# Patient Record
Sex: Female | Born: 1950 | Race: White | Hispanic: No | Marital: Married | Smoking: Never smoker
Health system: Southern US, Community
[De-identification: ages and names within clinical notes are randomized; demographics above are authoritative.]

## PROBLEM LIST (undated history)

## (undated) DIAGNOSIS — G4730 Sleep apnea, unspecified: Secondary | ICD-10-CM

## (undated) DIAGNOSIS — M810 Age-related osteoporosis without current pathological fracture: Secondary | ICD-10-CM

## (undated) DIAGNOSIS — E871 Hypo-osmolality and hyponatremia: Secondary | ICD-10-CM

## (undated) DIAGNOSIS — Z8781 Personal history of (healed) traumatic fracture: Secondary | ICD-10-CM

## (undated) DIAGNOSIS — I10 Essential (primary) hypertension: Secondary | ICD-10-CM

## (undated) DIAGNOSIS — T7840XA Allergy, unspecified, initial encounter: Secondary | ICD-10-CM

## (undated) DIAGNOSIS — K219 Gastro-esophageal reflux disease without esophagitis: Secondary | ICD-10-CM

## (undated) DIAGNOSIS — J45909 Unspecified asthma, uncomplicated: Secondary | ICD-10-CM

## (undated) DIAGNOSIS — M1990 Unspecified osteoarthritis, unspecified site: Secondary | ICD-10-CM

## (undated) DIAGNOSIS — R011 Cardiac murmur, unspecified: Secondary | ICD-10-CM

## (undated) DIAGNOSIS — D649 Anemia, unspecified: Secondary | ICD-10-CM

## (undated) HISTORY — DX: Cardiac murmur, unspecified: R01.1

## (undated) HISTORY — PX: OTHER SURGICAL HISTORY: SHX169

## (undated) HISTORY — DX: Anemia, unspecified: D64.9

## (undated) HISTORY — DX: Age-related osteoporosis without current pathological fracture: M81.0

## (undated) HISTORY — DX: Gastro-esophageal reflux disease without esophagitis: K21.9

## (undated) HISTORY — DX: Unspecified asthma, uncomplicated: J45.909

## (undated) HISTORY — DX: Essential (primary) hypertension: I10

## (undated) HISTORY — DX: Allergy, unspecified, initial encounter: T78.40XA

## (undated) HISTORY — DX: Unspecified osteoarthritis, unspecified site: M19.90

---

## 1998-07-11 ENCOUNTER — Other Ambulatory Visit: Admission: RE | Admit: 1998-07-11 | Discharge: 1998-07-11 | Payer: Self-pay | Admitting: *Deleted

## 1998-11-17 ENCOUNTER — Ambulatory Visit (HOSPITAL_BASED_OUTPATIENT_CLINIC_OR_DEPARTMENT_OTHER): Admission: RE | Admit: 1998-11-17 | Discharge: 1998-11-17 | Payer: Self-pay | Admitting: *Deleted

## 1999-08-02 ENCOUNTER — Other Ambulatory Visit: Admission: RE | Admit: 1999-08-02 | Discharge: 1999-08-02 | Payer: Self-pay | Admitting: *Deleted

## 1999-09-25 ENCOUNTER — Ambulatory Visit (HOSPITAL_COMMUNITY): Admission: RE | Admit: 1999-09-25 | Discharge: 1999-09-25 | Payer: Self-pay | Admitting: Obstetrics and Gynecology

## 1999-09-25 ENCOUNTER — Encounter: Payer: Self-pay | Admitting: Obstetrics and Gynecology

## 1999-09-27 ENCOUNTER — Encounter: Admission: RE | Admit: 1999-09-27 | Discharge: 1999-09-27 | Payer: Self-pay | Admitting: Obstetrics and Gynecology

## 1999-09-27 ENCOUNTER — Encounter: Payer: Self-pay | Admitting: Obstetrics and Gynecology

## 2000-02-26 HISTORY — PX: BREAST BIOPSY: SHX20

## 2000-03-31 ENCOUNTER — Encounter: Admission: RE | Admit: 2000-03-31 | Discharge: 2000-03-31 | Payer: Self-pay | Admitting: Obstetrics and Gynecology

## 2000-03-31 ENCOUNTER — Encounter: Payer: Self-pay | Admitting: Obstetrics and Gynecology

## 2000-08-13 ENCOUNTER — Other Ambulatory Visit: Admission: RE | Admit: 2000-08-13 | Discharge: 2000-08-13 | Payer: Self-pay | Admitting: *Deleted

## 2000-09-01 ENCOUNTER — Encounter: Payer: Self-pay | Admitting: Obstetrics and Gynecology

## 2000-09-01 ENCOUNTER — Encounter: Admission: RE | Admit: 2000-09-01 | Discharge: 2000-09-01 | Payer: Self-pay | Admitting: Obstetrics and Gynecology

## 2001-07-31 ENCOUNTER — Other Ambulatory Visit: Admission: RE | Admit: 2001-07-31 | Discharge: 2001-07-31 | Payer: Self-pay | Admitting: Obstetrics and Gynecology

## 2001-08-13 ENCOUNTER — Ambulatory Visit (HOSPITAL_COMMUNITY): Admission: RE | Admit: 2001-08-13 | Discharge: 2001-08-13 | Payer: Self-pay | Admitting: Obstetrics and Gynecology

## 2001-08-13 ENCOUNTER — Encounter: Payer: Self-pay | Admitting: Obstetrics and Gynecology

## 2002-08-06 ENCOUNTER — Other Ambulatory Visit: Admission: RE | Admit: 2002-08-06 | Discharge: 2002-08-06 | Payer: Self-pay | Admitting: Obstetrics and Gynecology

## 2002-08-16 ENCOUNTER — Ambulatory Visit (HOSPITAL_COMMUNITY): Admission: RE | Admit: 2002-08-16 | Discharge: 2002-08-16 | Payer: Self-pay | Admitting: Obstetrics and Gynecology

## 2002-08-16 ENCOUNTER — Encounter: Payer: Self-pay | Admitting: Obstetrics and Gynecology

## 2003-09-16 ENCOUNTER — Encounter: Admission: RE | Admit: 2003-09-16 | Discharge: 2003-09-16 | Payer: Self-pay | Admitting: Obstetrics and Gynecology

## 2003-10-05 ENCOUNTER — Encounter: Admission: RE | Admit: 2003-10-05 | Discharge: 2003-10-05 | Payer: Self-pay | Admitting: Obstetrics and Gynecology

## 2004-11-28 ENCOUNTER — Encounter: Admission: RE | Admit: 2004-11-28 | Discharge: 2004-11-28 | Payer: Self-pay | Admitting: *Deleted

## 2005-01-23 ENCOUNTER — Ambulatory Visit: Payer: Self-pay | Admitting: Gastroenterology

## 2005-02-27 ENCOUNTER — Ambulatory Visit: Payer: Self-pay | Admitting: Gastroenterology

## 2005-03-07 ENCOUNTER — Ambulatory Visit: Payer: Self-pay | Admitting: Gastroenterology

## 2005-12-11 ENCOUNTER — Encounter: Admission: RE | Admit: 2005-12-11 | Discharge: 2005-12-11 | Payer: Self-pay | Admitting: *Deleted

## 2005-12-25 ENCOUNTER — Encounter: Admission: RE | Admit: 2005-12-25 | Discharge: 2005-12-25 | Payer: Self-pay | Admitting: *Deleted

## 2007-01-13 ENCOUNTER — Encounter: Admission: RE | Admit: 2007-01-13 | Discharge: 2007-01-13 | Payer: Self-pay | Admitting: Obstetrics

## 2007-12-01 ENCOUNTER — Ambulatory Visit: Payer: Self-pay | Admitting: Gastroenterology

## 2007-12-01 DIAGNOSIS — R1032 Left lower quadrant pain: Secondary | ICD-10-CM

## 2007-12-01 LAB — CONVERTED CEMR LAB: CEA: 0.5 ng/mL (ref 0.0–5.0)

## 2007-12-03 LAB — CONVERTED CEMR LAB
AST: 17 units/L (ref 0–37)
Alkaline Phosphatase: 53 units/L (ref 39–117)
BUN: 8 mg/dL (ref 6–23)
Eosinophils Absolute: 0.2 10*3/uL (ref 0.0–0.7)
GFR calc non Af Amer: 110 mL/min
Glucose, Bld: 101 mg/dL — ABNORMAL HIGH (ref 70–99)
HCT: 38.9 % (ref 36.0–46.0)
Hemoglobin: 13 g/dL (ref 12.0–15.0)
MCV: 85.2 fL (ref 78.0–100.0)
Monocytes Absolute: 0.6 10*3/uL (ref 0.1–1.0)
Monocytes Relative: 7.2 % (ref 3.0–12.0)
Neutro Abs: 4.2 10*3/uL (ref 1.4–7.7)
RDW: 13.7 % (ref 11.5–14.6)
Sodium: 137 meq/L (ref 135–145)
Total Bilirubin: 0.8 mg/dL (ref 0.3–1.2)

## 2007-12-04 ENCOUNTER — Ambulatory Visit: Payer: Self-pay | Admitting: Cardiology

## 2008-01-01 ENCOUNTER — Ambulatory Visit: Payer: Self-pay | Admitting: Gastroenterology

## 2008-01-01 LAB — CONVERTED CEMR LAB
OCCULT 2: NEGATIVE
OCCULT 3: NEGATIVE

## 2008-01-06 LAB — CONVERTED CEMR LAB
Fecal Occult Blood: NEGATIVE
OCCULT 4: NEGATIVE
OCCULT 5: NEGATIVE

## 2008-01-25 ENCOUNTER — Encounter: Admission: RE | Admit: 2008-01-25 | Discharge: 2008-01-25 | Payer: Self-pay | Admitting: Obstetrics

## 2008-02-05 ENCOUNTER — Ambulatory Visit: Payer: Self-pay | Admitting: Gastroenterology

## 2009-03-23 ENCOUNTER — Encounter: Admission: RE | Admit: 2009-03-23 | Discharge: 2009-03-23 | Payer: Self-pay | Admitting: Obstetrics

## 2009-09-19 ENCOUNTER — Encounter: Admission: RE | Admit: 2009-09-19 | Discharge: 2009-09-19 | Payer: Self-pay | Admitting: Obstetrics

## 2010-03-16 ENCOUNTER — Encounter: Payer: Self-pay | Admitting: Gastroenterology

## 2010-03-29 NOTE — Letter (Signed)
Summary: Colonoscopy Date Change Letter  [facility]  [address] [place], [place] [address]   Phone: [phone]  Fax: [phone]      March 16, 2010 MRN: [id number]   [facility] [address] [place], [place]  [address]   Dear Ms. Abdella,   Previously you were recommended to have a repeat colonoscopy around this time. Your chart was recently reviewed by DR. Shihab States of [facility]. Follow up colonoscopy is now recommended in JAN 2017. This revised recommendation is based on current, nationally recognized guidelines for colorectal cancer screening and polyp surveillance. These guidelines are endorsed by the American Cancer Society, [employer] on Colorectal Cancer as well as numerous other major medical organizations.  Please understand that our recommendation assumes that you do not have any new symptoms such as bleeding, a change in bowel habits, anemia, or significant abdominal discomfort. If you do have any concerning GI symptoms or want to discuss the guideline recommendations, please call to arrange an office visit at your earliest convenience. Otherwise we will keep you in our reminder system and contact you 1-2 months prior to the date listed above to schedule your next colonoscopy.  Thank you,  Rachael Fee, M.D.  [facility] [phone]

## 2010-04-25 ENCOUNTER — Other Ambulatory Visit: Payer: Self-pay | Admitting: Obstetrics

## 2010-04-25 DIAGNOSIS — Z1231 Encounter for screening mammogram for malignant neoplasm of breast: Secondary | ICD-10-CM

## 2010-04-30 ENCOUNTER — Ambulatory Visit
Admission: RE | Admit: 2010-04-30 | Discharge: 2010-04-30 | Disposition: A | Discharge status: Home / Self Care | Payer: BC Managed Care – PPO | Source: Ambulatory Visit | Attending: Obstetrics | Admitting: Obstetrics

## 2010-04-30 DIAGNOSIS — Z1231 Encounter for screening mammogram for malignant neoplasm of breast: Secondary | ICD-10-CM

## 2011-06-10 ENCOUNTER — Other Ambulatory Visit: Payer: Self-pay | Admitting: Obstetrics

## 2011-06-10 DIAGNOSIS — Z1231 Encounter for screening mammogram for malignant neoplasm of breast: Secondary | ICD-10-CM

## 2011-06-27 ENCOUNTER — Ambulatory Visit
Admission: RE | Admit: 2011-06-27 | Discharge: 2011-06-27 | Disposition: A | Discharge status: Home / Self Care | Payer: BC Managed Care – PPO | Source: Ambulatory Visit | Attending: Obstetrics | Admitting: Obstetrics

## 2011-06-27 DIAGNOSIS — Z1231 Encounter for screening mammogram for malignant neoplasm of breast: Secondary | ICD-10-CM

## 2011-12-02 ENCOUNTER — Other Ambulatory Visit: Payer: Self-pay | Admitting: Obstetrics

## 2011-12-02 DIAGNOSIS — M810 Age-related osteoporosis without current pathological fracture: Secondary | ICD-10-CM

## 2011-12-12 ENCOUNTER — Ambulatory Visit
Admission: RE | Admit: 2011-12-12 | Discharge: 2011-12-12 | Disposition: A | Discharge status: Home / Self Care | Payer: BC Managed Care – PPO | Source: Ambulatory Visit | Attending: Obstetrics | Admitting: Obstetrics

## 2011-12-12 DIAGNOSIS — M810 Age-related osteoporosis without current pathological fracture: Secondary | ICD-10-CM

## 2012-02-26 HISTORY — PX: OTHER SURGICAL HISTORY: SHX169

## 2012-08-20 ENCOUNTER — Other Ambulatory Visit: Payer: Self-pay

## 2012-08-20 DIAGNOSIS — Z1231 Encounter for screening mammogram for malignant neoplasm of breast: Secondary | ICD-10-CM

## 2012-09-17 ENCOUNTER — Ambulatory Visit: Payer: BC Managed Care – PPO

## 2012-12-31 ENCOUNTER — Other Ambulatory Visit: Payer: Self-pay | Admitting: Dermatology

## 2013-01-11 ENCOUNTER — Ambulatory Visit
Admission: RE | Admit: 2013-01-11 | Discharge: 2013-01-11 | Disposition: A | Discharge status: Home / Self Care | Payer: BC Managed Care – PPO | Source: Ambulatory Visit

## 2013-01-11 DIAGNOSIS — Z1231 Encounter for screening mammogram for malignant neoplasm of breast: Secondary | ICD-10-CM

## 2013-02-08 ENCOUNTER — Other Ambulatory Visit: Payer: Self-pay | Admitting: Obstetrics

## 2013-02-08 DIAGNOSIS — M8580 Other specified disorders of bone density and structure, unspecified site: Secondary | ICD-10-CM

## 2013-09-15 ENCOUNTER — Ambulatory Visit
Admission: RE | Admit: 2013-09-15 | Discharge: 2013-09-15 | Disposition: A | Discharge status: Home / Self Care | Payer: BC Managed Care – PPO | Source: Ambulatory Visit | Attending: Obstetrics | Admitting: Obstetrics

## 2013-09-15 ENCOUNTER — Encounter (INDEPENDENT_AMBULATORY_CARE_PROVIDER_SITE_OTHER): Payer: Self-pay

## 2013-09-15 DIAGNOSIS — M8580 Other specified disorders of bone density and structure, unspecified site: Secondary | ICD-10-CM

## 2014-01-31 ENCOUNTER — Other Ambulatory Visit: Payer: Self-pay

## 2014-01-31 DIAGNOSIS — Z1231 Encounter for screening mammogram for malignant neoplasm of breast: Secondary | ICD-10-CM

## 2014-02-11 ENCOUNTER — Ambulatory Visit: Payer: BC Managed Care – PPO

## 2014-02-28 ENCOUNTER — Ambulatory Visit
Admission: RE | Admit: 2014-02-28 | Discharge: 2014-02-28 | Disposition: A | Discharge status: Home / Self Care | Payer: BC Managed Care – PPO | Source: Ambulatory Visit

## 2014-02-28 DIAGNOSIS — Z1231 Encounter for screening mammogram for malignant neoplasm of breast: Secondary | ICD-10-CM

## 2015-03-29 ENCOUNTER — Other Ambulatory Visit: Payer: Self-pay

## 2015-03-29 DIAGNOSIS — Z1231 Encounter for screening mammogram for malignant neoplasm of breast: Secondary | ICD-10-CM

## 2015-04-14 ENCOUNTER — Ambulatory Visit
Admission: RE | Admit: 2015-04-14 | Discharge: 2015-04-14 | Disposition: A | Discharge status: Home / Self Care | Payer: BC Managed Care – PPO | Source: Ambulatory Visit

## 2015-04-14 DIAGNOSIS — Z1231 Encounter for screening mammogram for malignant neoplasm of breast: Secondary | ICD-10-CM

## 2015-05-18 ENCOUNTER — Encounter: Payer: Self-pay | Admitting: Internal Medicine

## 2016-02-26 HISTORY — PX: COLONOSCOPY W/ POLYPECTOMY: SHX1380

## 2016-03-28 ENCOUNTER — Other Ambulatory Visit: Payer: Self-pay | Admitting: Obstetrics

## 2016-03-28 DIAGNOSIS — M8580 Other specified disorders of bone density and structure, unspecified site: Secondary | ICD-10-CM

## 2016-03-28 DIAGNOSIS — Z1231 Encounter for screening mammogram for malignant neoplasm of breast: Secondary | ICD-10-CM

## 2016-04-15 ENCOUNTER — Ambulatory Visit
Admission: RE | Admit: 2016-04-15 | Discharge: 2016-04-15 | Disposition: A | Discharge status: Home / Self Care | Payer: BC Managed Care – PPO | Source: Ambulatory Visit | Attending: Obstetrics | Admitting: Obstetrics

## 2016-04-15 DIAGNOSIS — M8580 Other specified disorders of bone density and structure, unspecified site: Secondary | ICD-10-CM

## 2016-04-15 DIAGNOSIS — Z1231 Encounter for screening mammogram for malignant neoplasm of breast: Secondary | ICD-10-CM

## 2016-07-01 ENCOUNTER — Encounter: Payer: Self-pay | Admitting: Gastroenterology

## 2016-09-18 ENCOUNTER — Encounter: Payer: Self-pay | Admitting: Gastroenterology

## 2016-09-18 ENCOUNTER — Ambulatory Visit (AMBULATORY_SURGERY_CENTER): Payer: Self-pay

## 2016-09-18 VITALS — Ht 63.0 in | Wt 223.4 lb

## 2016-09-18 DIAGNOSIS — Z1211 Encounter for screening for malignant neoplasm of colon: Secondary | ICD-10-CM

## 2016-09-18 MED ORDER — NA SULFATE-K SULFATE-MG SULF 17.5-3.13-1.6 GM/177ML PO SOLN: 1.0000 | Freq: Once | ORAL | 0 refills | Status: AC

## 2016-09-18 NOTE — Progress Notes (Signed)
Denies allergies to eggs or soy products. Denies complication of anesthesia or sedation. Denies use of weight loss medication. Denies use of O2.   Emmi instructions declined.  

## 2016-09-24 ENCOUNTER — Ambulatory Visit (AMBULATORY_SURGERY_CENTER): Payer: BC Managed Care – PPO | Admitting: Gastroenterology

## 2016-09-24 ENCOUNTER — Encounter: Payer: Self-pay | Admitting: Gastroenterology

## 2016-09-24 VITALS — BP 145/82 | HR 69 | Temp 98.0°F | Resp 17 | Ht 63.0 in | Wt 223.0 lb

## 2016-09-24 DIAGNOSIS — K5730 Diverticulosis of large intestine without perforation or abscess without bleeding: Secondary | ICD-10-CM

## 2016-09-24 DIAGNOSIS — D123 Benign neoplasm of transverse colon: Secondary | ICD-10-CM | POA: Diagnosis not present

## 2016-09-24 DIAGNOSIS — Z1211 Encounter for screening for malignant neoplasm of colon: Secondary | ICD-10-CM | POA: Diagnosis present

## 2016-09-24 DIAGNOSIS — Z1212 Encounter for screening for malignant neoplasm of rectum: Secondary | ICD-10-CM

## 2016-09-24 MED ORDER — SODIUM CHLORIDE 0.9 % IV SOLN: 500.0000 mL | INTRAVENOUS | Status: AC

## 2016-09-24 NOTE — Patient Instructions (Signed)
YOU HAD AN ENDOSCOPIC PROCEDURE TODAY AT THE [facility]:   Refer to the procedure report that was given to you for any specific questions about what was found during the examination.  If the procedure report does not answer your questions, please call your gastroenterologist to clarify.  If you requested that your care partner not be given the details of your procedure findings, then the procedure report has been included in a sealed envelope for you to review at your convenience later.  YOU SHOULD EXPECT: Some feelings of bloating in the abdomen. Passage of more gas than usual.  Walking can help get rid of the air that was put into your GI tract during the procedure and reduce the bloating. If you had a lower endoscopy (such as a colonoscopy or flexible sigmoidoscopy) you may notice spotting of blood in your stool or on the toilet paper. If you underwent a bowel prep for your procedure, you may not have a normal bowel movement for a few days.  Please Note:  You might notice some irritation and congestion in your nose or some drainage.  This is from the oxygen used during your procedure.  There is no need for concern and it should clear up in a day or so.  SYMPTOMS TO REPORT IMMEDIATELY:   Following lower endoscopy (colonoscopy or flexible sigmoidoscopy):  Excessive amounts of blood in the stool  Significant tenderness or worsening of abdominal pains  Swelling of the abdomen that is new, acute  Fever of 100F or higher   For urgent or emergent issues, a gastroenterologist can be reached at any hour by calling [phone]. Please read all handouts given to you by your recovery nurse today.  DIET:  We do recommend a small meal at first, but then you may proceed to your regular diet.  Drink plenty of fluids but you should avoid alcoholic beverages for 24 hours.  ACTIVITY:  You should plan to take it easy for the rest of today and you should NOT DRIVE or use heavy machinery until  tomorrow (because of the sedation medicines used during the test).    FOLLOW UP: Our staff will call the number listed on your records the next business day following your procedure to check on you and address any questions or concerns that you may have regarding the information given to you following your procedure. If we do not reach you, we will leave a message.  However, if you are feeling well and you are not experiencing any problems, there is no need to return our call.  We will assume that you have returned to your regular daily activities without incident.  If any biopsies were taken you will be contacted by phone or by letter within the next 1-3 weeks.  Please call us at [phone] if you have not heard about the biopsies in 3 weeks.    SIGNATURES/CONFIDENTIALITY: You and/or your care partner have signed paperwork which will be entered into your electronic medical record.  These signatures attest to the fact that that the information above on your After Visit Summary has been reviewed and is understood.  Full responsibility of the confidentiality of this discharge information lies with you and/or your care-partner.  Thank you for letting us take care of your healthcare needs today. 

## 2016-09-24 NOTE — Op Note (Signed)
[name] Procedure Date: 09/24/2016 11:03 AM MRN: [id number] Endoscopist: Milus Banister , MD Age: 66 Referring MD:  Date of Birth: [birth year] Gender: Female Account #: [id number] Procedure:                Colonoscopy Indications:              Screening for colorectal malignant neoplasm Medicines:                Monitored Anesthesia Care Procedure:                Pre-Anesthesia Assessment:                           - Prior to the procedure, a History and Physical                            was performed, and patient medications and                            allergies were reviewed. The patient's tolerance of                            previous anesthesia was also reviewed. The risks                            and benefits of the procedure and the sedation                            options and risks were discussed with the patient.                            All questions were answered, and informed consent                            was obtained. Prior Anticoagulants: The patient has                            taken no previous anticoagulant or antiplatelet                            agents. ASA Grade Assessment: II - A patient with                            mild systemic disease. After reviewing the risks                            and benefits, the patient was deemed in                            satisfactory condition to undergo the procedure.                           After obtaining informed consent, the colonoscope  was passed under direct vision. Throughout the                            procedure, the patient's blood pressure, pulse, and                            oxygen saturations were monitored continuously. The                            Colonoscope was introduced through the anus and                            advanced to the the cecum, identified by                            appendiceal orifice and  ileocecal valve. The                            colonoscopy was performed without difficulty. The                            patient tolerated the procedure well. The quality                            of the bowel preparation was good. The ileocecal                            valve, appendiceal orifice, and rectum were                            photographed. Scope In: 11:11:29 AM Scope Out: 11:21:43 AM Scope Withdrawal Time: 0 hours 6 minutes 2 seconds  Total Procedure Duration: 0 hours 10 minutes 14 seconds  Findings:                 A 2 mm polyp was found in the transverse colon. The                            polyp was sessile. The polyp was removed with a                            cold biopsy forceps. Resection and retrieval were                            complete.                           Multiple small and large-mouthed diverticula were                            found in the left colon with associated mucosal                            edema, narrowing, erythema.  The exam was otherwise without abnormality on                            direct and retroflexion views. Complications:            No immediate complications. Estimated blood loss:                            None. Estimated Blood Loss:     Estimated blood loss: none. Impression:               - One 2 mm polyp in the transverse colon, removed                            with a cold biopsy forceps. Resected and retrieved.                           - Diverticulosis in the left colon with associated                            mucosal edema, narrowing, erythema.                           - The examination was otherwise normal on direct                            and retroflexion views. Recommendation:           - Patient has a contact number available for                            emergencies. The signs and symptoms of potential                            delayed complications were discussed with  the                            patient. Return to normal activities tomorrow.                            Written discharge instructions were provided to the                            patient.                           - Resume previous diet.                           - Continue present medications.                           You will receive a letter within 2-3 weeks with the                            pathology results and my final recommendations.  If the polyp(s) is proven to be 'pre-cancerous' on                            pathology, you will need repeat colonoscopy in 5                            years. If the polyp(s) is NOT 'precancerous' on                            pathology then you should repeat colon cancer                            screening in 10 years with colonoscopy without need                            for colon cancer screening by any method prior to                            then (including stool testing). Milus Banister, MD 09/24/2016 11:27:09 AM This report has been signed electronically.

## 2016-09-24 NOTE — Progress Notes (Signed)
To PACU VSS. Report to RN.tb 

## 2016-09-24 NOTE — Progress Notes (Signed)
Called to room to assist during endoscopic procedure.  Patient ID and intended procedure confirmed with present staff. Received instructions for my participation in the procedure from the performing physician.  

## 2016-09-24 NOTE — Progress Notes (Signed)
Pt. Reports no change in her surgical or medical history since pre-visit 09/18/2016.

## 2016-09-25 ENCOUNTER — Telehealth: Payer: Self-pay

## 2016-09-25 NOTE — Telephone Encounter (Signed)
  Follow up Call-  Call back number 09/24/2016  Post procedure Call Back phone  # [phone]  Permission to leave phone message Yes  Some recent data might be hidden     Patient questions:  Do you have a fever, pain , or abdominal swelling? No. Pain Score  0 *  Have you tolerated food without any problems? Yes.    Have you been able to return to your normal activities? Yes.    Do you have any questions about your discharge instructions: Diet   No. Medications  No. Follow up visit  No.  Do you have questions or concerns about your Care? No.  Actions: * If pain score is 4 or above: No action needed, pain <4.  No problems noted per pt. maw

## 2016-09-27 ENCOUNTER — Encounter: Payer: Self-pay | Admitting: Gastroenterology

## 2017-07-22 ENCOUNTER — Other Ambulatory Visit: Payer: Self-pay | Admitting: Obstetrics

## 2017-07-22 DIAGNOSIS — Z1231 Encounter for screening mammogram for malignant neoplasm of breast: Secondary | ICD-10-CM

## 2017-08-13 ENCOUNTER — Ambulatory Visit
Admission: RE | Admit: 2017-08-13 | Discharge: 2017-08-13 | Disposition: A | Discharge status: Home / Self Care | Payer: BC Managed Care – PPO | Source: Ambulatory Visit | Attending: Obstetrics | Admitting: Obstetrics

## 2017-08-13 DIAGNOSIS — Z1231 Encounter for screening mammogram for malignant neoplasm of breast: Secondary | ICD-10-CM

## 2017-10-22 ENCOUNTER — Other Ambulatory Visit: Payer: Self-pay | Admitting: Obstetrics

## 2017-10-22 DIAGNOSIS — M810 Age-related osteoporosis without current pathological fracture: Secondary | ICD-10-CM

## 2018-05-22 ENCOUNTER — Other Ambulatory Visit: Payer: BC Managed Care – PPO

## 2018-07-15 ENCOUNTER — Other Ambulatory Visit: Payer: BC Managed Care – PPO

## 2018-09-07 ENCOUNTER — Other Ambulatory Visit: Payer: Self-pay | Admitting: Obstetrics

## 2018-09-07 DIAGNOSIS — Z1231 Encounter for screening mammogram for malignant neoplasm of breast: Secondary | ICD-10-CM

## 2018-09-14 ENCOUNTER — Other Ambulatory Visit: Payer: BC Managed Care – PPO

## 2018-09-23 ENCOUNTER — Other Ambulatory Visit: Payer: Self-pay

## 2018-09-23 ENCOUNTER — Ambulatory Visit
Admission: RE | Admit: 2018-09-23 | Discharge: 2018-09-23 | Disposition: A | Discharge status: Home / Self Care | Payer: Medicare Other | Source: Ambulatory Visit | Attending: Obstetrics | Admitting: Obstetrics

## 2018-09-23 DIAGNOSIS — M810 Age-related osteoporosis without current pathological fracture: Secondary | ICD-10-CM

## 2018-10-23 ENCOUNTER — Ambulatory Visit
Admission: RE | Admit: 2018-10-23 | Discharge: 2018-10-23 | Disposition: A | Discharge status: Home / Self Care | Payer: BC Managed Care – PPO | Source: Ambulatory Visit | Attending: Obstetrics | Admitting: Obstetrics

## 2018-10-23 ENCOUNTER — Other Ambulatory Visit: Payer: Self-pay

## 2018-10-23 DIAGNOSIS — Z1231 Encounter for screening mammogram for malignant neoplasm of breast: Secondary | ICD-10-CM

## 2019-06-10 DIAGNOSIS — L821 Other seborrheic keratosis: Secondary | ICD-10-CM | POA: Diagnosis not present

## 2019-06-21 DIAGNOSIS — H1045 Other chronic allergic conjunctivitis: Secondary | ICD-10-CM | POA: Diagnosis not present

## 2019-06-21 DIAGNOSIS — J3081 Allergic rhinitis due to animal (cat) (dog) hair and dander: Secondary | ICD-10-CM | POA: Diagnosis not present

## 2019-06-21 DIAGNOSIS — J4540 Moderate persistent asthma, uncomplicated: Secondary | ICD-10-CM | POA: Diagnosis not present

## 2019-06-21 DIAGNOSIS — J3089 Other allergic rhinitis: Secondary | ICD-10-CM | POA: Diagnosis not present

## 2019-07-27 DIAGNOSIS — E559 Vitamin D deficiency, unspecified: Secondary | ICD-10-CM | POA: Diagnosis not present

## 2019-07-27 DIAGNOSIS — Z79899 Other long term (current) drug therapy: Secondary | ICD-10-CM | POA: Diagnosis not present

## 2019-07-27 DIAGNOSIS — I10 Essential (primary) hypertension: Secondary | ICD-10-CM | POA: Diagnosis not present

## 2019-07-27 DIAGNOSIS — E7800 Pure hypercholesterolemia, unspecified: Secondary | ICD-10-CM | POA: Diagnosis not present

## 2019-07-27 DIAGNOSIS — Z1331 Encounter for screening for depression: Secondary | ICD-10-CM | POA: Diagnosis not present

## 2019-07-27 DIAGNOSIS — Z6839 Body mass index (BMI) 39.0-39.9, adult: Secondary | ICD-10-CM | POA: Diagnosis not present

## 2019-07-27 DIAGNOSIS — J45909 Unspecified asthma, uncomplicated: Secondary | ICD-10-CM | POA: Diagnosis not present

## 2019-07-27 DIAGNOSIS — M810 Age-related osteoporosis without current pathological fracture: Secondary | ICD-10-CM | POA: Diagnosis not present

## 2019-07-27 DIAGNOSIS — D692 Other nonthrombocytopenic purpura: Secondary | ICD-10-CM | POA: Diagnosis not present

## 2019-08-18 DIAGNOSIS — E871 Hypo-osmolality and hyponatremia: Secondary | ICD-10-CM | POA: Diagnosis not present

## 2019-09-17 ENCOUNTER — Other Ambulatory Visit: Payer: Self-pay | Admitting: Obstetrics

## 2019-09-17 DIAGNOSIS — Z1231 Encounter for screening mammogram for malignant neoplasm of breast: Secondary | ICD-10-CM

## 2019-11-03 ENCOUNTER — Other Ambulatory Visit: Payer: Self-pay

## 2019-11-03 ENCOUNTER — Ambulatory Visit
Admission: RE | Admit: 2019-11-03 | Discharge: 2019-11-03 | Disposition: A | Discharge status: Home / Self Care | Payer: Medicare Other | Source: Ambulatory Visit | Attending: Obstetrics | Admitting: Obstetrics

## 2019-11-03 DIAGNOSIS — Z1231 Encounter for screening mammogram for malignant neoplasm of breast: Secondary | ICD-10-CM

## 2019-11-15 DIAGNOSIS — E785 Hyperlipidemia, unspecified: Secondary | ICD-10-CM | POA: Diagnosis not present

## 2019-11-15 DIAGNOSIS — Z9181 History of falling: Secondary | ICD-10-CM | POA: Diagnosis not present

## 2019-11-15 DIAGNOSIS — Z6839 Body mass index (BMI) 39.0-39.9, adult: Secondary | ICD-10-CM | POA: Diagnosis not present

## 2019-11-15 DIAGNOSIS — Z1331 Encounter for screening for depression: Secondary | ICD-10-CM | POA: Diagnosis not present

## 2019-11-15 DIAGNOSIS — Z0000 Encounter for general adult medical examination without abnormal findings: Secondary | ICD-10-CM | POA: Diagnosis not present

## 2019-12-07 DIAGNOSIS — L820 Inflamed seborrheic keratosis: Secondary | ICD-10-CM | POA: Diagnosis not present

## 2019-12-07 DIAGNOSIS — L814 Other melanin hyperpigmentation: Secondary | ICD-10-CM | POA: Diagnosis not present

## 2019-12-07 DIAGNOSIS — D2371 Other benign neoplasm of skin of right lower limb, including hip: Secondary | ICD-10-CM | POA: Diagnosis not present

## 2019-12-07 DIAGNOSIS — D692 Other nonthrombocytopenic purpura: Secondary | ICD-10-CM | POA: Diagnosis not present

## 2019-12-07 DIAGNOSIS — L821 Other seborrheic keratosis: Secondary | ICD-10-CM | POA: Diagnosis not present

## 2019-12-07 DIAGNOSIS — L718 Other rosacea: Secondary | ICD-10-CM | POA: Diagnosis not present

## 2020-01-28 DIAGNOSIS — J45909 Unspecified asthma, uncomplicated: Secondary | ICD-10-CM | POA: Diagnosis not present

## 2020-01-28 DIAGNOSIS — Z79899 Other long term (current) drug therapy: Secondary | ICD-10-CM | POA: Diagnosis not present

## 2020-01-28 DIAGNOSIS — M810 Age-related osteoporosis without current pathological fracture: Secondary | ICD-10-CM | POA: Diagnosis not present

## 2020-01-28 DIAGNOSIS — E7800 Pure hypercholesterolemia, unspecified: Secondary | ICD-10-CM | POA: Diagnosis not present

## 2020-01-28 DIAGNOSIS — Z6833 Body mass index (BMI) 33.0-33.9, adult: Secondary | ICD-10-CM | POA: Diagnosis not present

## 2020-01-28 DIAGNOSIS — I10 Essential (primary) hypertension: Secondary | ICD-10-CM | POA: Diagnosis not present

## 2020-01-28 DIAGNOSIS — Z139 Encounter for screening, unspecified: Secondary | ICD-10-CM | POA: Diagnosis not present

## 2020-06-01 DIAGNOSIS — H1045 Other chronic allergic conjunctivitis: Secondary | ICD-10-CM | POA: Diagnosis not present

## 2020-06-01 DIAGNOSIS — J3089 Other allergic rhinitis: Secondary | ICD-10-CM | POA: Diagnosis not present

## 2020-06-01 DIAGNOSIS — J3081 Allergic rhinitis due to animal (cat) (dog) hair and dander: Secondary | ICD-10-CM | POA: Diagnosis not present

## 2020-06-01 DIAGNOSIS — J4540 Moderate persistent asthma, uncomplicated: Secondary | ICD-10-CM | POA: Diagnosis not present

## 2020-06-21 DIAGNOSIS — Z01411 Encounter for gynecological examination (general) (routine) with abnormal findings: Secondary | ICD-10-CM | POA: Diagnosis not present

## 2020-06-21 DIAGNOSIS — Z01419 Encounter for gynecological examination (general) (routine) without abnormal findings: Secondary | ICD-10-CM | POA: Diagnosis not present

## 2020-06-21 DIAGNOSIS — Z124 Encounter for screening for malignant neoplasm of cervix: Secondary | ICD-10-CM | POA: Diagnosis not present

## 2020-06-21 DIAGNOSIS — N952 Postmenopausal atrophic vaginitis: Secondary | ICD-10-CM | POA: Diagnosis not present

## 2020-06-21 DIAGNOSIS — M810 Age-related osteoporosis without current pathological fracture: Secondary | ICD-10-CM | POA: Diagnosis not present

## 2020-06-21 DIAGNOSIS — N762 Acute vulvitis: Secondary | ICD-10-CM | POA: Diagnosis not present

## 2020-06-21 DIAGNOSIS — Z6832 Body mass index (BMI) 32.0-32.9, adult: Secondary | ICD-10-CM | POA: Diagnosis not present

## 2020-06-23 ENCOUNTER — Other Ambulatory Visit: Payer: Self-pay | Admitting: Obstetrics

## 2020-06-23 DIAGNOSIS — M810 Age-related osteoporosis without current pathological fracture: Secondary | ICD-10-CM

## 2020-07-21 DIAGNOSIS — Z6832 Body mass index (BMI) 32.0-32.9, adult: Secondary | ICD-10-CM | POA: Diagnosis not present

## 2020-07-21 DIAGNOSIS — W57XXXA Bitten or stung by nonvenomous insect and other nonvenomous arthropods, initial encounter: Secondary | ICD-10-CM | POA: Diagnosis not present

## 2020-07-21 DIAGNOSIS — S70269A Insect bite (nonvenomous), unspecified hip, initial encounter: Secondary | ICD-10-CM | POA: Diagnosis not present

## 2020-07-28 DIAGNOSIS — I10 Essential (primary) hypertension: Secondary | ICD-10-CM | POA: Diagnosis not present

## 2020-07-28 DIAGNOSIS — E559 Vitamin D deficiency, unspecified: Secondary | ICD-10-CM | POA: Diagnosis not present

## 2020-07-28 DIAGNOSIS — E7800 Pure hypercholesterolemia, unspecified: Secondary | ICD-10-CM | POA: Diagnosis not present

## 2020-07-28 DIAGNOSIS — K219 Gastro-esophageal reflux disease without esophagitis: Secondary | ICD-10-CM | POA: Diagnosis not present

## 2020-07-28 DIAGNOSIS — L309 Dermatitis, unspecified: Secondary | ICD-10-CM | POA: Diagnosis not present

## 2020-07-28 DIAGNOSIS — D692 Other nonthrombocytopenic purpura: Secondary | ICD-10-CM | POA: Diagnosis not present

## 2020-07-28 DIAGNOSIS — Z79899 Other long term (current) drug therapy: Secondary | ICD-10-CM | POA: Diagnosis not present

## 2020-07-28 DIAGNOSIS — Z6832 Body mass index (BMI) 32.0-32.9, adult: Secondary | ICD-10-CM | POA: Diagnosis not present

## 2020-11-16 DIAGNOSIS — E785 Hyperlipidemia, unspecified: Secondary | ICD-10-CM | POA: Diagnosis not present

## 2020-11-16 DIAGNOSIS — Z6841 Body Mass Index (BMI) 40.0 and over, adult: Secondary | ICD-10-CM | POA: Diagnosis not present

## 2020-11-16 DIAGNOSIS — Z9181 History of falling: Secondary | ICD-10-CM | POA: Diagnosis not present

## 2020-11-16 DIAGNOSIS — Z1331 Encounter for screening for depression: Secondary | ICD-10-CM | POA: Diagnosis not present

## 2020-11-16 DIAGNOSIS — Z0000 Encounter for general adult medical examination without abnormal findings: Secondary | ICD-10-CM | POA: Diagnosis not present

## 2020-11-16 DIAGNOSIS — E669 Obesity, unspecified: Secondary | ICD-10-CM | POA: Diagnosis not present

## 2020-12-01 ENCOUNTER — Other Ambulatory Visit: Payer: Self-pay | Admitting: Obstetrics

## 2020-12-01 DIAGNOSIS — Z1231 Encounter for screening mammogram for malignant neoplasm of breast: Secondary | ICD-10-CM

## 2021-01-04 ENCOUNTER — Ambulatory Visit
Admission: RE | Admit: 2021-01-04 | Discharge: 2021-01-04 | Disposition: A | Discharge status: Home / Self Care | Payer: Medicare PPO | Source: Ambulatory Visit | Attending: Obstetrics | Admitting: Obstetrics

## 2021-01-04 ENCOUNTER — Other Ambulatory Visit: Payer: Self-pay

## 2021-01-04 DIAGNOSIS — Z780 Asymptomatic menopausal state: Secondary | ICD-10-CM | POA: Diagnosis not present

## 2021-01-04 DIAGNOSIS — M810 Age-related osteoporosis without current pathological fracture: Secondary | ICD-10-CM

## 2021-01-04 DIAGNOSIS — Z1231 Encounter for screening mammogram for malignant neoplasm of breast: Secondary | ICD-10-CM | POA: Diagnosis not present

## 2021-01-04 DIAGNOSIS — M85851 Other specified disorders of bone density and structure, right thigh: Secondary | ICD-10-CM | POA: Diagnosis not present

## 2021-01-10 DIAGNOSIS — D692 Other nonthrombocytopenic purpura: Secondary | ICD-10-CM | POA: Diagnosis not present

## 2021-01-10 DIAGNOSIS — L718 Other rosacea: Secondary | ICD-10-CM | POA: Diagnosis not present

## 2021-01-10 DIAGNOSIS — L821 Other seborrheic keratosis: Secondary | ICD-10-CM | POA: Diagnosis not present

## 2021-01-10 DIAGNOSIS — L814 Other melanin hyperpigmentation: Secondary | ICD-10-CM | POA: Diagnosis not present

## 2021-01-29 DIAGNOSIS — E7800 Pure hypercholesterolemia, unspecified: Secondary | ICD-10-CM | POA: Diagnosis not present

## 2021-01-29 DIAGNOSIS — K219 Gastro-esophageal reflux disease without esophagitis: Secondary | ICD-10-CM | POA: Diagnosis not present

## 2021-01-29 DIAGNOSIS — Z6835 Body mass index (BMI) 35.0-35.9, adult: Secondary | ICD-10-CM | POA: Diagnosis not present

## 2021-01-29 DIAGNOSIS — I10 Essential (primary) hypertension: Secondary | ICD-10-CM | POA: Diagnosis not present

## 2021-01-29 DIAGNOSIS — Z79899 Other long term (current) drug therapy: Secondary | ICD-10-CM | POA: Diagnosis not present

## 2021-01-30 DIAGNOSIS — J3089 Other allergic rhinitis: Secondary | ICD-10-CM | POA: Diagnosis not present

## 2021-01-30 DIAGNOSIS — H1045 Other chronic allergic conjunctivitis: Secondary | ICD-10-CM | POA: Diagnosis not present

## 2021-01-30 DIAGNOSIS — J4540 Moderate persistent asthma, uncomplicated: Secondary | ICD-10-CM | POA: Diagnosis not present

## 2021-01-30 DIAGNOSIS — J3081 Allergic rhinitis due to animal (cat) (dog) hair and dander: Secondary | ICD-10-CM | POA: Diagnosis not present

## 2021-04-19 DIAGNOSIS — D2371 Other benign neoplasm of skin of right lower limb, including hip: Secondary | ICD-10-CM | POA: Diagnosis not present

## 2021-04-19 DIAGNOSIS — D485 Neoplasm of uncertain behavior of skin: Secondary | ICD-10-CM | POA: Diagnosis not present

## 2021-07-17 ENCOUNTER — Encounter: Payer: Self-pay | Admitting: Gastroenterology

## 2021-07-18 DIAGNOSIS — N952 Postmenopausal atrophic vaginitis: Secondary | ICD-10-CM | POA: Diagnosis not present

## 2021-07-18 DIAGNOSIS — Z6835 Body mass index (BMI) 35.0-35.9, adult: Secondary | ICD-10-CM | POA: Diagnosis not present

## 2021-07-18 DIAGNOSIS — Z01419 Encounter for gynecological examination (general) (routine) without abnormal findings: Secondary | ICD-10-CM | POA: Diagnosis not present

## 2021-07-18 DIAGNOSIS — M810 Age-related osteoporosis without current pathological fracture: Secondary | ICD-10-CM | POA: Diagnosis not present

## 2021-07-18 DIAGNOSIS — Z124 Encounter for screening for malignant neoplasm of cervix: Secondary | ICD-10-CM | POA: Diagnosis not present

## 2021-07-18 DIAGNOSIS — Z0142 Encounter for cervical smear to confirm findings of recent normal smear following initial abnormal smear: Secondary | ICD-10-CM | POA: Diagnosis not present

## 2021-07-18 DIAGNOSIS — Z01411 Encounter for gynecological examination (general) (routine) with abnormal findings: Secondary | ICD-10-CM | POA: Diagnosis not present

## 2021-08-01 DIAGNOSIS — R490 Dysphonia: Secondary | ICD-10-CM | POA: Diagnosis not present

## 2021-08-01 DIAGNOSIS — Z79899 Other long term (current) drug therapy: Secondary | ICD-10-CM | POA: Diagnosis not present

## 2021-08-01 DIAGNOSIS — I10 Essential (primary) hypertension: Secondary | ICD-10-CM | POA: Diagnosis not present

## 2021-08-01 DIAGNOSIS — E7800 Pure hypercholesterolemia, unspecified: Secondary | ICD-10-CM | POA: Diagnosis not present

## 2021-08-01 DIAGNOSIS — Z139 Encounter for screening, unspecified: Secondary | ICD-10-CM | POA: Diagnosis not present

## 2021-08-01 DIAGNOSIS — K219 Gastro-esophageal reflux disease without esophagitis: Secondary | ICD-10-CM | POA: Diagnosis not present

## 2021-08-07 DIAGNOSIS — R1032 Left lower quadrant pain: Secondary | ICD-10-CM | POA: Diagnosis not present

## 2021-09-08 DIAGNOSIS — S79912A Unspecified injury of left hip, initial encounter: Secondary | ICD-10-CM | POA: Diagnosis not present

## 2021-09-08 DIAGNOSIS — I10 Essential (primary) hypertension: Secondary | ICD-10-CM | POA: Diagnosis not present

## 2021-09-08 DIAGNOSIS — S60511A Abrasion of right hand, initial encounter: Secondary | ICD-10-CM | POA: Diagnosis not present

## 2021-09-08 DIAGNOSIS — S32412A Displaced fracture of anterior wall of left acetabulum, initial encounter for closed fracture: Secondary | ICD-10-CM | POA: Diagnosis not present

## 2021-09-08 DIAGNOSIS — S80212A Abrasion, left knee, initial encounter: Secondary | ICD-10-CM | POA: Diagnosis not present

## 2021-09-08 DIAGNOSIS — S32432A Displaced fracture of anterior column [iliopubic] of left acetabulum, initial encounter for closed fracture: Secondary | ICD-10-CM | POA: Diagnosis not present

## 2021-09-08 DIAGNOSIS — S60512A Abrasion of left hand, initial encounter: Secondary | ICD-10-CM | POA: Diagnosis not present

## 2021-09-08 DIAGNOSIS — M25552 Pain in left hip: Secondary | ICD-10-CM | POA: Diagnosis not present

## 2021-09-10 DIAGNOSIS — S32402A Unspecified fracture of left acetabulum, initial encounter for closed fracture: Secondary | ICD-10-CM | POA: Diagnosis not present

## 2021-09-12 DIAGNOSIS — R399 Unspecified symptoms and signs involving the genitourinary system: Secondary | ICD-10-CM | POA: Diagnosis not present

## 2021-09-17 DIAGNOSIS — S32402D Unspecified fracture of left acetabulum, subsequent encounter for fracture with routine healing: Secondary | ICD-10-CM | POA: Diagnosis not present

## 2021-10-15 DIAGNOSIS — S32402D Unspecified fracture of left acetabulum, subsequent encounter for fracture with routine healing: Secondary | ICD-10-CM | POA: Diagnosis not present

## 2021-10-18 DIAGNOSIS — J309 Allergic rhinitis, unspecified: Secondary | ICD-10-CM | POA: Diagnosis not present

## 2021-10-18 DIAGNOSIS — R490 Dysphonia: Secondary | ICD-10-CM | POA: Diagnosis not present

## 2021-10-18 NOTE — Progress Notes (Signed)
 Otolaryngology Clinic Note  HPI:    Cassandra Spears is a 71 y.o. female who presents as a new patient.  Ms. Shumard presents today with complaint of hoarseness which has been present since February 2023.  At its onset she does not remember any type of significant illness or event.  No recent head or neck surgery.  She does not smoke.  She does sing in a church choir in notices worsening of her voice after singing.  She also notices that her hoarseness seems to get worse during the course of a normal day as she talks more.  She is followed by Dr. Altamease for her allergic rhinitis and asthma.  She does take a proton pump inhibitor daily  PMH/Meds/All/SocHx/FamHx/ROS:   History reviewed. No pertinent past medical history.  History reviewed. No pertinent surgical history.  No family history of bleeding disorders, wound healing problems or difficulty with anesthesia.   Social History   Socioeconomic History  . Marital status: Single    Spouse name: Not on file  . Number of children: Not on file  . Years of education: Not on file  . Highest education level: Not on file  Occupational History  . Not on file  Tobacco Use  . Smoking status: Never  . Smokeless tobacco: Never  Substance and Sexual Activity  . Alcohol use: Not Currently  . Drug use: Not Currently  . Sexual activity: Not on file  Other Topics Concern  . Not on file  Social History Narrative  . Not on file   Social Determinants of Health   Financial Resource Strain: Not on file  Food Insecurity: Not on file  Transportation Needs: Not on file  Physical Activity: Not on file  Stress: Not on file  Social Connections: Not on file  Housing Stability: Not on file     Current Outpatient Medications:  .  albuterol 2.5 mg /3 mL (0.083 %) nebulizer solution, U 1 VIAL PER NEB Q 4 H PRF WHEEZING OR SOB, Disp: , Rfl:  .  alendronate (FOSAMAX) 70 MG tablet, TK 1 T PO WEEKLY, Disp: , Rfl:  .  amoxicillin-clavulanate (AUGMENTIN)  875-125 mg per tablet, Take 1 tablet by mouth 2 times daily with meals., Disp: , Rfl:  .  ascorbic acid, vitamin C, (VITAMIN C) 1000 MG tablet, Take 1 tablet (1,000 mg total) by mouth., Disp: , Rfl:  .  azelaic acid (FINACEA) 15 % topical gel, APPLY TOPICALLY TO FACE AS DIRECTED, Disp: , Rfl:  .  azelastine (ASTELIN) 137 mcg (0.1 %) nasal spray, 1-2 puff in each nostril, Disp: , Rfl:  .  betamethasone dipropionate (DIPROSONE) 0.05 % cream, APPLY THIN LAYER TOPICALLY TO THE AFFECTED AREA TWICE DAILY AS NEEDED FOR ECZEMA FLARES, Disp: , Rfl:  .  cholecalciferol  (VITAMIN D3) 1000 UNIT Tab, Take 1 tablet (1,000 Units total) by mouth., Disp: , Rfl:  .  cyanocobalamin  (VITAMIN B12) 500 MCG tablet, Take 1 tablet (500 mcg total) by mouth daily., Disp: , Rfl:  .  esomeprazole (NEXIUM) 20 MG capsule,  , Disp: , Rfl:  .  estradioL (ESTRACE) 0.01 % (0.1 mg/gram) vaginal cream,  , Disp: , Rfl:  .  ferrous sulfate (FERATAB) 325 (65 FE) MG tablet, Take 1 tablet (325 mg total) by mouth., Disp: , Rfl:  .  fexofenadine (ALLEGRA) 180 MG tablet, 1 tablet as needed, Disp: , Rfl:  .  fluticasone  furoate-vilanteroL (BREO ELLIPTA ) 100-25 mcg/dose inhaler, Inhale 1 puff into the lungs daily., Disp: ,  Rfl:  .  HYDROcodone-acetaminophen  (NORCO) 5-325 mg per tablet, TAKE 1 TABLET BY MOUTH EVERY 4 TO 6 HOURS AS NEEDED FOR PAIN, Disp: , Rfl:  .  ketotifen (ZADITOR) 0.025 % (0.035 %) ophthalmic solution, 1 drop into affected eye, Disp: , Rfl:  .  lisinopriL  (PRINIVIL ,ZESTRIL ) 10 MG tablet, TK 1 T PO QD FOR HIGH BP, Disp: , Rfl:  .  mometasone (NASONEX) 50 mcg/actuation nasal spray, 2 sprays in each nostril, Disp: , Rfl:  .  montelukast  (SINGULAIR ) 10 mg tablet, 1 tablet in the evening, Disp: , Rfl:  .  ondansetron (ZOFRAN-ODT) 4 MG disintegrating tablet, DISSOLVE 1 TABLET ON THE TONGUE EVERY 6 HOURS AS NEEDED FOR NAUSEA OR VOMITING, Disp: , Rfl:  .  oxyCODONE-acetaminophen  (PERCOCET) 5-325 mg per tablet, TAKE 1 TABLET BY MOUTH  EVERY 6 HOURS AS NEEDED FOR PAIN. NO DRIVING OR HEAVY LIFTING. MAY CAUSE DROWSINESS, Disp: , Rfl:  .  rosuvastatin  (CRESTOR ) 10 MG tablet, TAKE 1 TABLET BY MOUTH EVERY NIGHT AT BEDTIME FOR HIGH CHOLESTEROL, Disp: , Rfl:   A complete ROS was performed with pertinent positives/negatives noted in the HPI. The remainder of the ROS are negative.    Physical Exam:    BP 172/91 (Site: Left arm, Position: Sitting, BP Cuff Size: Medium)   Pulse 70   Temp 97.5 F (36.4 C) (Temporal)   Ht 1.6 m (5' 2.99)   Wt 87.5 kg (193 lb)   BMI 34.20 kg/m   Constitutional:  Patient appears well-nourished and well-developed. No acute distress.   Head/Face: Facial features are symmetric. Skull is normocephalic. Hair and scalp are normal. Normal temporal artery pulses. TMJ shows no joint deformity swelling or erythema.   Eyes: Pupils are equal, round and reactive to light. Conjunctiva and lids are normal. Normal extraocular mobility. Normal vision by patient report.   Ears:     Right: Pinna and external meatus normal, normal ear canal skin and caliber without excessive cerumen or drainage. Tympanic membranes intact without effusion or infection. Hearing normal.    Left: Pinna and external meatus normal, normal ear canal skin and caliber without excessive cerumen or drainage. Tympanic membranes intact without effusion or infection. Hearing normal.   Nose/Sinus/Nasopharynx: Septum is normal. Normal nasal mucosa. Normal inferior turbinates. Normal middle and superior turbinates, sinus ostia patent without obstruction, mass or discharge. Nasopharynx patent.   Oral cavity/Oropharynx: Lips normal, teeth and gums normal with good dentition, normal oral vestibule. Normal floor of mouth, tongue and oral mucosa, no mucosal lesions, ulcer or mass, normal tongue mobility.  Hard and soft palate normal with normal mobility. One plus tonsils, no erythema or exudate. Base of tongue, retromolar trigone and oral  pharynx normal. Normal sensation, mobility and gag.   Neck: No cervical lymphadenopathy, mass or swelling. Salivary glands normal to palpation without swelling, erythema or mass. Normal facial nerve function. Normal thyroid gland palpation.   Neurological: Alert and oriented to self, place and time.  Normal reflexes and motor skills, balance and coordination.   Psychiatric: No unusual anxiety or evidence of depression. Appropriate affect.     Independent Review of Additional Tests or Records:  None.  Procedures:  Flexible Laryngoscopy Procedure Note  Indications: Hoarseness  Risks, benefits and clinical relevance of the study were discussed. The patient understands and agrees to proceed.   Procedure: After adequate topical anesthetic was applied, 4 mm flexible laryngoscope was passed through the nasal cavity without difficulty. Flexible laryngoscopy shows patent anterior nasal cavity with minimal crusting, no  discharge or infection.  Normal base of tongue and supraglottis Normal vocal cord mobility without vocal cord nodule, mass, polyp or tumor.  Slight bowing of true vocal folds.  Hypopharynx normal without mass, pooling of secretions or aspiration.  Patient tolerated procedure without complication or difficulty.   Alm RAMAN. Spainhour, PA-C   Impression & Plans:   1) Hoarseness   Continue current medications Patient encouraged to exercise restraint in her vocal activities We will refer to [facility] laryngology/speech-language pathology  Alm RAMAN. Spainhour, PA-C [facility]     Electronically signed by: Alm Cassis Spainhour, PA-C 10/18/21 1253

## 2021-11-16 DIAGNOSIS — S32402D Unspecified fracture of left acetabulum, subsequent encounter for fracture with routine healing: Secondary | ICD-10-CM | POA: Diagnosis not present

## 2021-11-19 DIAGNOSIS — H1045 Other chronic allergic conjunctivitis: Secondary | ICD-10-CM | POA: Diagnosis not present

## 2021-11-19 DIAGNOSIS — J3089 Other allergic rhinitis: Secondary | ICD-10-CM | POA: Diagnosis not present

## 2021-11-19 DIAGNOSIS — J4540 Moderate persistent asthma, uncomplicated: Secondary | ICD-10-CM | POA: Diagnosis not present

## 2021-11-19 DIAGNOSIS — J3081 Allergic rhinitis due to animal (cat) (dog) hair and dander: Secondary | ICD-10-CM | POA: Diagnosis not present

## 2021-12-12 ENCOUNTER — Other Ambulatory Visit: Payer: Self-pay | Admitting: Obstetrics

## 2021-12-12 DIAGNOSIS — Z1231 Encounter for screening mammogram for malignant neoplasm of breast: Secondary | ICD-10-CM

## 2022-01-05 IMAGING — MG DIGITAL SCREENING BILAT W/ TOMO W/ CAD
8 series · 8 of 24 positions shown · non-contrast
Comparison: Previous exam(s).

CLINICAL DATA: Screening.

EXAM:
DIGITAL SCREENING BILATERAL MAMMOGRAM WITH TOMO AND CAD

[R CC synth-2D]
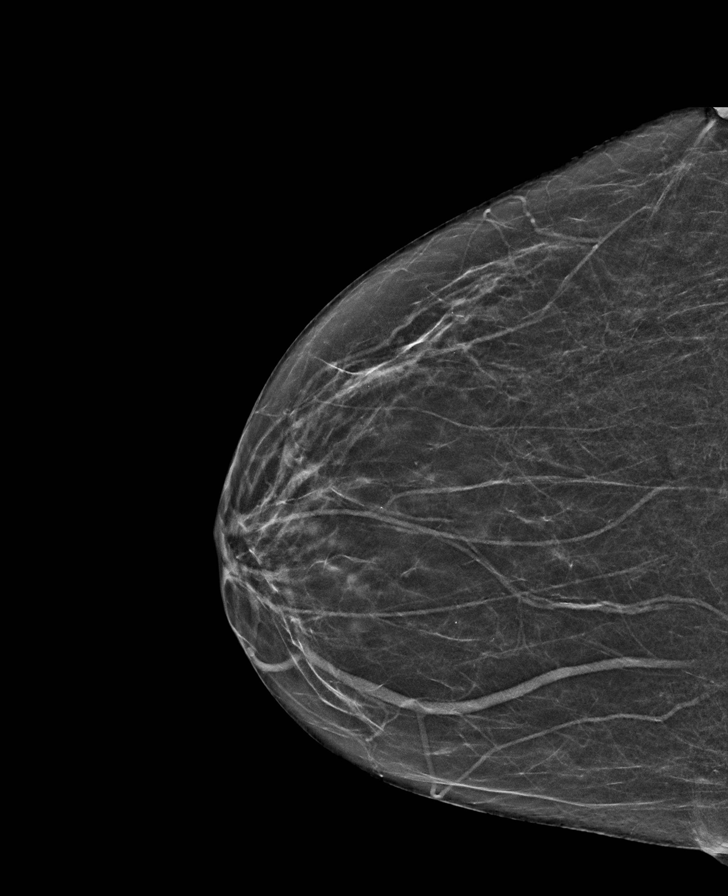

[R MLO synth-2D]
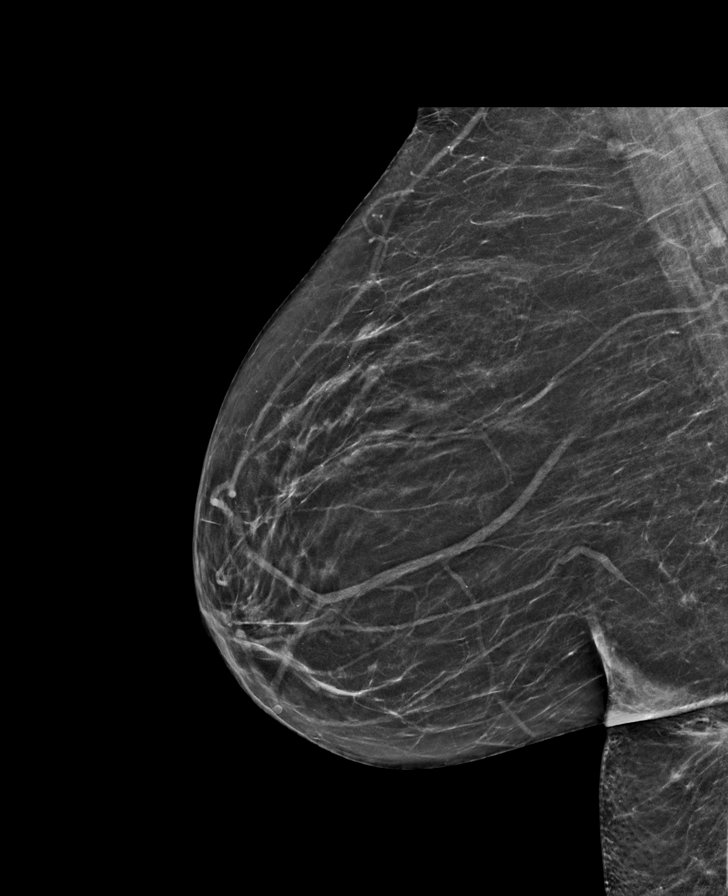

[L CC synth-2D]
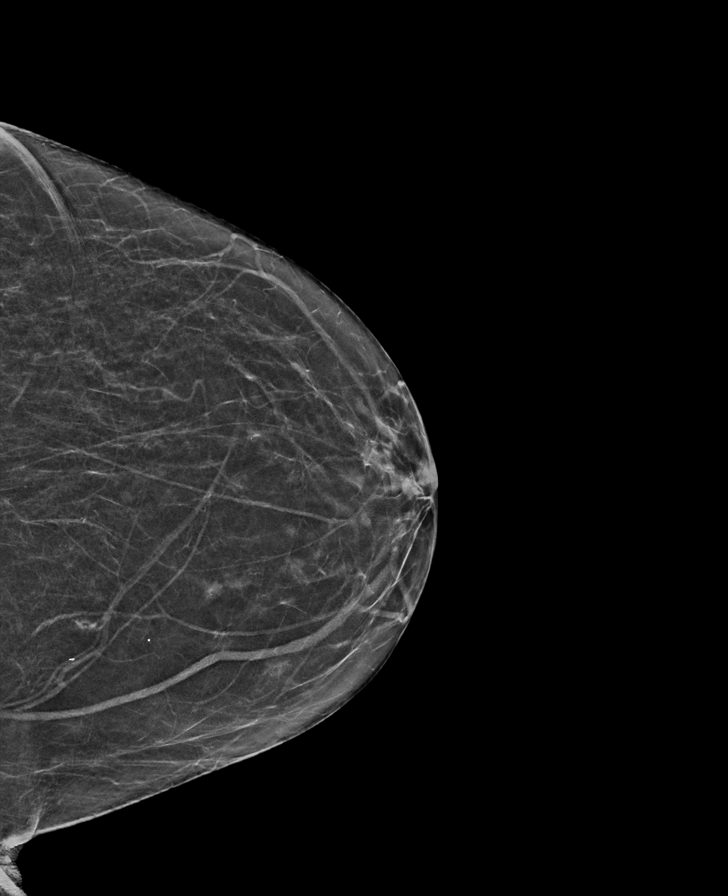

[L MLO synth-2D]
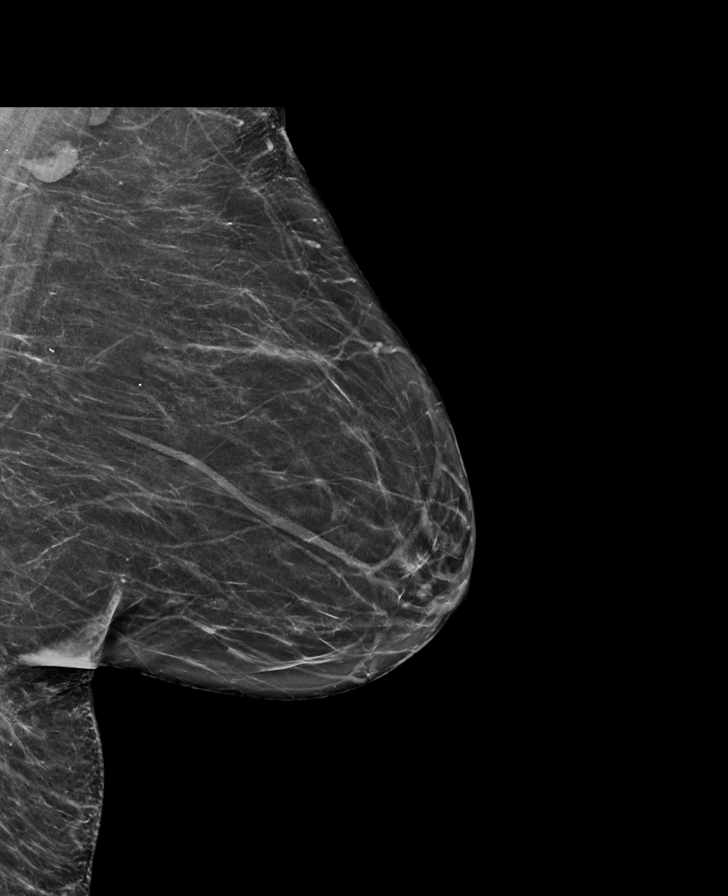

[R CC tomo · tomo slice 27/52.0]
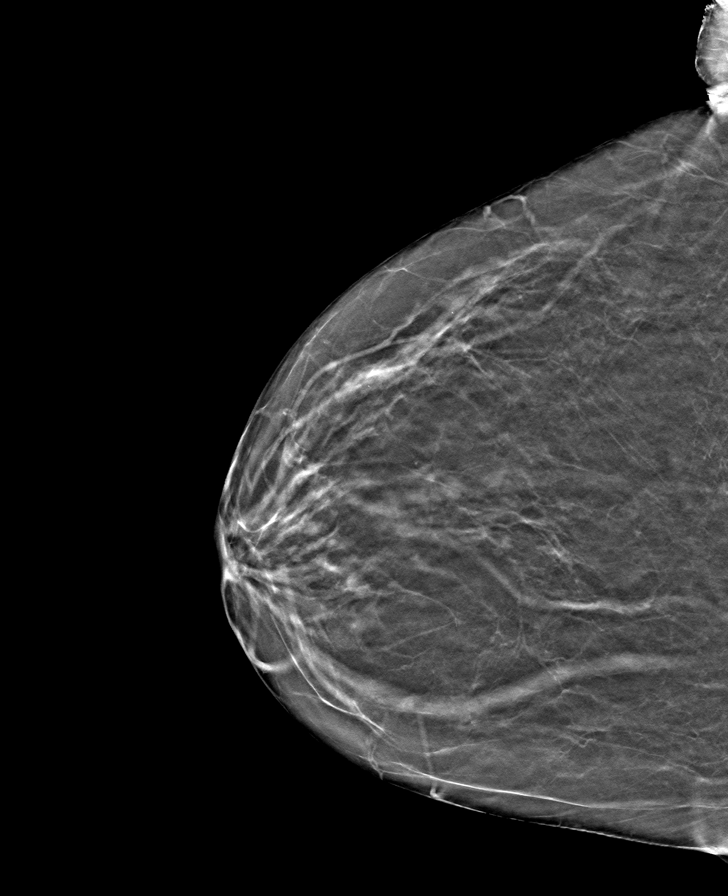

[L MLO tomo · tomo slice 32/63.0]
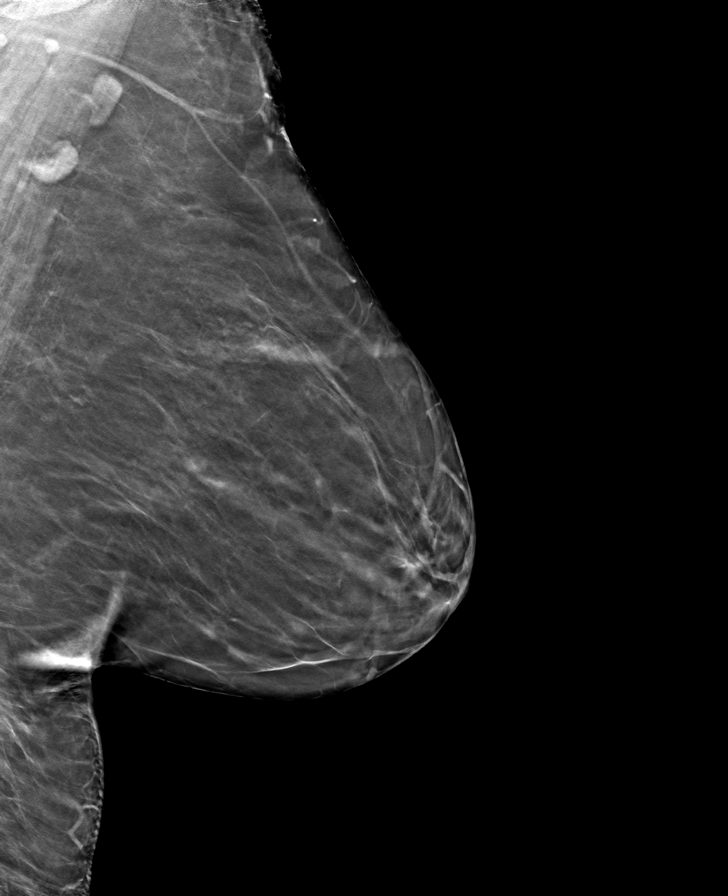

[R MLO tomo · tomo slice 31/60.0]
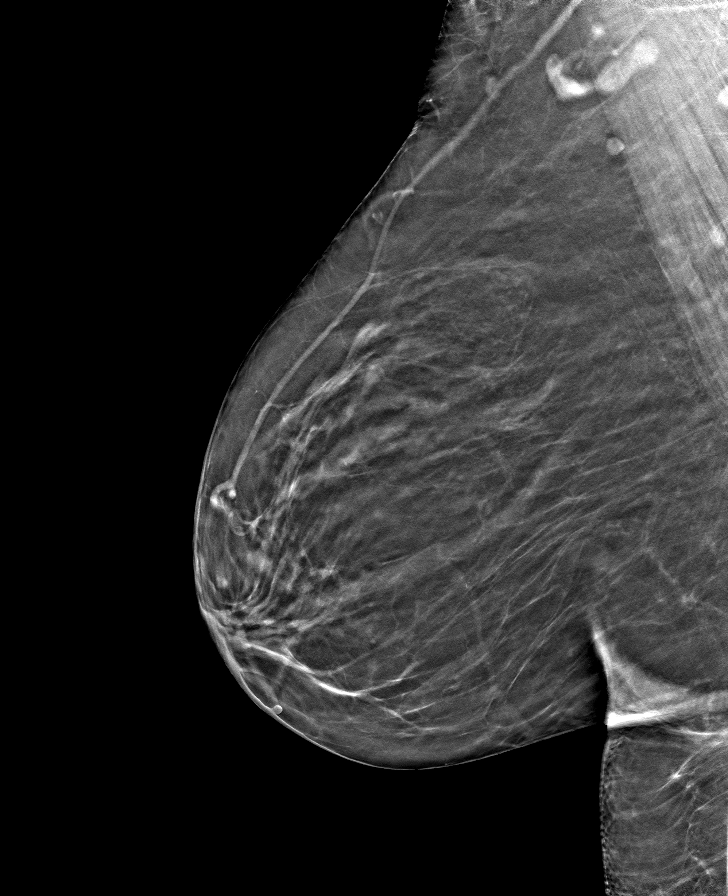

[L CC tomo · tomo slice 27/52.0]
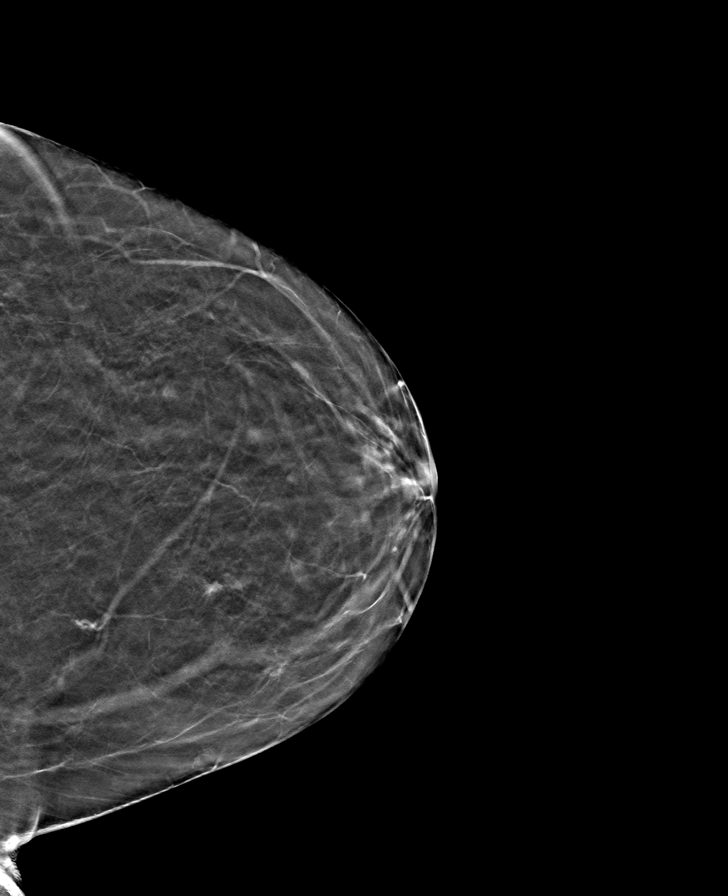

[8 of 24 positions shown; findings below may reference images not displayed]

ACR Breast Density Category b: There are scattered areas of
fibroglandular density.
FINDINGS: There are no findings suspicious for malignancy. Images were
processed with CAD.
IMPRESSION: No mammographic evidence of malignancy. A result letter of this
screening mammogram will be mailed directly to the patient.

RECOMMENDATION:
Screening mammogram in one year. (Code:CN-U-775)

BI-RADS CATEGORY  1: Negative.

## 2022-01-14 DIAGNOSIS — J45901 Unspecified asthma with (acute) exacerbation: Secondary | ICD-10-CM | POA: Diagnosis not present

## 2022-01-14 DIAGNOSIS — Z6838 Body mass index (BMI) 38.0-38.9, adult: Secondary | ICD-10-CM | POA: Diagnosis not present

## 2022-01-30 ENCOUNTER — Ambulatory Visit: Payer: Medicare PPO

## 2022-01-31 DIAGNOSIS — R490 Dysphonia: Secondary | ICD-10-CM | POA: Diagnosis not present

## 2022-01-31 DIAGNOSIS — J383 Other diseases of vocal cords: Secondary | ICD-10-CM | POA: Diagnosis not present

## 2022-01-31 DIAGNOSIS — J385 Laryngeal spasm: Secondary | ICD-10-CM | POA: Diagnosis not present

## 2022-02-09 DIAGNOSIS — Z20822 Contact with and (suspected) exposure to covid-19: Secondary | ICD-10-CM | POA: Diagnosis not present

## 2022-02-09 DIAGNOSIS — R059 Cough, unspecified: Secondary | ICD-10-CM | POA: Diagnosis not present

## 2022-02-09 DIAGNOSIS — J40 Bronchitis, not specified as acute or chronic: Secondary | ICD-10-CM | POA: Diagnosis not present

## 2022-02-13 DIAGNOSIS — E559 Vitamin D deficiency, unspecified: Secondary | ICD-10-CM | POA: Diagnosis not present

## 2022-02-13 DIAGNOSIS — Z79899 Other long term (current) drug therapy: Secondary | ICD-10-CM | POA: Diagnosis not present

## 2022-02-13 DIAGNOSIS — E7800 Pure hypercholesterolemia, unspecified: Secondary | ICD-10-CM | POA: Diagnosis not present

## 2022-02-15 DIAGNOSIS — K219 Gastro-esophageal reflux disease without esophagitis: Secondary | ICD-10-CM | POA: Diagnosis not present

## 2022-02-15 DIAGNOSIS — E7800 Pure hypercholesterolemia, unspecified: Secondary | ICD-10-CM | POA: Diagnosis not present

## 2022-02-15 DIAGNOSIS — Z9181 History of falling: Secondary | ICD-10-CM | POA: Diagnosis not present

## 2022-02-15 DIAGNOSIS — Z1331 Encounter for screening for depression: Secondary | ICD-10-CM | POA: Diagnosis not present

## 2022-02-15 DIAGNOSIS — E559 Vitamin D deficiency, unspecified: Secondary | ICD-10-CM | POA: Diagnosis not present

## 2022-02-15 DIAGNOSIS — I10 Essential (primary) hypertension: Secondary | ICD-10-CM | POA: Diagnosis not present

## 2022-02-15 DIAGNOSIS — D692 Other nonthrombocytopenic purpura: Secondary | ICD-10-CM | POA: Diagnosis not present

## 2022-02-15 DIAGNOSIS — J45909 Unspecified asthma, uncomplicated: Secondary | ICD-10-CM | POA: Diagnosis not present

## 2022-03-19 DIAGNOSIS — J385 Laryngeal spasm: Secondary | ICD-10-CM | POA: Diagnosis not present

## 2022-03-27 ENCOUNTER — Ambulatory Visit
Admission: RE | Admit: 2022-03-27 | Discharge: 2022-03-27 | Disposition: A | Discharge status: Home / Self Care | Payer: Medicare PPO | Source: Ambulatory Visit | Attending: Obstetrics | Admitting: Obstetrics

## 2022-03-27 DIAGNOSIS — Z1231 Encounter for screening mammogram for malignant neoplasm of breast: Secondary | ICD-10-CM | POA: Diagnosis not present

## 2022-04-02 DIAGNOSIS — J385 Laryngeal spasm: Secondary | ICD-10-CM | POA: Diagnosis not present

## 2022-04-17 DIAGNOSIS — J385 Laryngeal spasm: Secondary | ICD-10-CM | POA: Diagnosis not present

## 2022-05-01 DIAGNOSIS — R490 Dysphonia: Secondary | ICD-10-CM | POA: Diagnosis not present

## 2022-05-01 NOTE — Progress Notes (Signed)
 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 A telehealth visit was utilized in order to decrease the patient's potential exposure to COVID-19 vs an in-person visit.   The patient/authorized person provided oral consent at the time of the visit to engaging in a telemedicine encounter with the present provider at [facility]. The patient/authorized person was informed of the potential benefits, limitations, and risks of telemedicine. The patient/authorized person expressed understanding that the laws that protect confidentiality also apply to telemedicine. The patient/authorized person acknowledged understanding that telemedicine does not provide emergency services and that he or she would need to call 911 or proceed to the nearest hospital for help if such a need arose.   Total time spent in the clinical discussion: Time: 42 minutes. Telehealth Modality: Video visit (audio & video)     This clinician has read and verified the Behavioral & Qualitative Analysis of Voice and Resonance (CPT [id number]) from the initial visit.   SUBJECTIVE: On this date, the patient reported symptoms have moderately improved since her last session. Specifically, she notes increased ease in her upper range and increased endurance with prolonged voice use when singing. Continues to note motivation and satisfaction with progress.    OBJECTIVE:   The patient was seen for approximately 35 minutes under care of voice specialized skilled intervention with a focus on the following goals:    Long-Term Goals 1.  To maximize vocal effectiveness relative to the existing laryngeal disorder   2.  The patient will learn to avoid dysphonic exacerbations and restore functional voice use for daily  occupational, social, and emotional demands.   3.  The patient will demonstrate comprehension and adherence to recommendations for vocal hygiene and voice exercises to maximize effectiveness in daily communication in activities of daily living    Short-Term Goals        1. The patient will independently acknowledge possible connections between voice and psychosocial issues with 80% accuracy.  Progress: The patient verbalized possible connections between voice and psychosocial issues with 80% accuracy. Patient acknowledged the following psychosocial issues: frustration with loss of singing ability.   2. The patient will independently implement laryngeal hygiene recommendations with 80% accuracy.  Progress: Excellent progress with daily hydration.   3. The patient will perform the introduced head/neck stretches to increase muscular flexibility for coordinated voice production with 80% accuracy.  Progress: Patient benefited from the clinician's visual models along with verbal instruction cues, verbal cues to maintain diaphragmatic respiration while stretching, and tactile cues to apply over-pressure as indicated.    4. The patient will demonstrate awareness of laryngeal tension and hyperfunction and perform circumlaryngeal massage techniques to decrease tension and vocal effort. Progress: Not addressed today.    5. The patient will perform diaphragmatic (low abdominal) respiration to optimize voicing efficiency and decrease laryngeal hyperfunction with 80% accuracy given minimal cueing and support from the SLP. Progress: Patient benefited from the clinician's visual models and tactile cueing of placing one hand on her abdomen and one hand on her chest for increased awareness. Benefited from circular shoulder rolls with cues for inhalation/exhalation for shoulder release.    6. The patient will increase awareness of breath holding as evidenced by ability to discriminate holding vs release in  structured tasks and spontaneous speech tasks with 80% accuracy  given minimal cueing from the SLP. Progress: Demonstrated excellent progress with low abdominal breathing exercises and release of breath with phonation.    7. The patient will produce resonant voice/flow phonation during vocal flexibility postures to decrease laryngeal hyperfunction with 80% accuracy given minimal cueing from the SLP. Progress: The patient demonstrated open throat phonation with forward resonance during semi occluded vocal tract exercises. The following exercises were used: cup bubbles, kazoo buzz with ascending/descending pitch glides.     8. The patient will produce resonant voice/flow phonation in spontaneous conversational settings (e.g., background noise, emotional topics, speaking at a distance, occupational conversations) with 80% accuracy given minimal cueing from the SLP. Progress: Demonstrated increased forward resonance and improved respiratory coordination with introduction of Resonant Voice Therapy with moderate carryover into spontaneous speech.    9. The patient will use balanced voice mechanics without evidence of strain while singing within lower-upper range. Patient will implement SOVT strategies advancing through hierarchy of resistance up to C5.  Progress: Demonstrated improved coordination of subsystems with SOVT hierarchy through passaggio.    The patient has achieved 80% accuracy in overall achievement of long-term voice therapy goals.    Assessment: Dysphonia Severity: mild characterized by mild strain and mild roughness with decreased intermittent voice breaks with singing voice   Response to Treatment: Patient demonstrates excellent progress given 4 sessions of voice therapy. She notes improved vocal quality, increased vocal range, increased vocal stamina, and stability of upper range in singing voice. Patient reports satisfaction with progress.    Plan: Given the patient's progress made  towards the voice therapy goals, no further skilled voice therapy services are indicated at this juncture. The patient was encouraged to continue utilizing strategies for improved vocal mechanics in all functional communicative contexts to facilitate carryover of skills learned. Ms. TAKYRA CANTRALL was also recommended to maintain optimal laryngeal hygiene (reduced throat clearing, increased hydration, etc) for improved laryngeal health. The patient will contact the [facility] if changes in symptoms are suspected and follow-up evaluation is warranted in the future.   HEP: warm-up before choir rehearsal SOVT cup bubbles/kazoo buzz glides 2x daily, mum with piano pitch matching, hum song (on radio/for fun), vocal budgeting during choir rehearsals   K. Laroy Cornea, MM, MS, CCC-SLP

## 2022-05-13 DIAGNOSIS — L2089 Other atopic dermatitis: Secondary | ICD-10-CM | POA: Diagnosis not present

## 2022-05-13 DIAGNOSIS — L814 Other melanin hyperpigmentation: Secondary | ICD-10-CM | POA: Diagnosis not present

## 2022-05-13 DIAGNOSIS — L821 Other seborrheic keratosis: Secondary | ICD-10-CM | POA: Diagnosis not present

## 2022-05-13 DIAGNOSIS — L718 Other rosacea: Secondary | ICD-10-CM | POA: Diagnosis not present

## 2022-05-13 DIAGNOSIS — D1801 Hemangioma of skin and subcutaneous tissue: Secondary | ICD-10-CM | POA: Diagnosis not present

## 2022-06-12 DIAGNOSIS — J3089 Other allergic rhinitis: Secondary | ICD-10-CM | POA: Diagnosis not present

## 2022-06-12 DIAGNOSIS — J4540 Moderate persistent asthma, uncomplicated: Secondary | ICD-10-CM | POA: Diagnosis not present

## 2022-06-12 DIAGNOSIS — H1045 Other chronic allergic conjunctivitis: Secondary | ICD-10-CM | POA: Diagnosis not present

## 2022-06-12 DIAGNOSIS — J3081 Allergic rhinitis due to animal (cat) (dog) hair and dander: Secondary | ICD-10-CM | POA: Diagnosis not present

## 2022-08-01 DIAGNOSIS — Z01411 Encounter for gynecological examination (general) (routine) with abnormal findings: Secondary | ICD-10-CM | POA: Diagnosis not present

## 2022-08-01 DIAGNOSIS — Z6838 Body mass index (BMI) 38.0-38.9, adult: Secondary | ICD-10-CM | POA: Diagnosis not present

## 2022-08-01 DIAGNOSIS — M810 Age-related osteoporosis without current pathological fracture: Secondary | ICD-10-CM | POA: Diagnosis not present

## 2022-08-01 DIAGNOSIS — N952 Postmenopausal atrophic vaginitis: Secondary | ICD-10-CM | POA: Diagnosis not present

## 2022-08-01 DIAGNOSIS — Z124 Encounter for screening for malignant neoplasm of cervix: Secondary | ICD-10-CM | POA: Diagnosis not present

## 2022-08-01 DIAGNOSIS — Z01419 Encounter for gynecological examination (general) (routine) without abnormal findings: Secondary | ICD-10-CM | POA: Diagnosis not present

## 2022-08-08 ENCOUNTER — Other Ambulatory Visit: Payer: Self-pay | Admitting: Obstetrics

## 2022-08-08 DIAGNOSIS — M810 Age-related osteoporosis without current pathological fracture: Secondary | ICD-10-CM

## 2022-08-21 DIAGNOSIS — E7800 Pure hypercholesterolemia, unspecified: Secondary | ICD-10-CM | POA: Diagnosis not present

## 2022-08-21 DIAGNOSIS — Z79899 Other long term (current) drug therapy: Secondary | ICD-10-CM | POA: Diagnosis not present

## 2022-09-04 DIAGNOSIS — K219 Gastro-esophageal reflux disease without esophagitis: Secondary | ICD-10-CM | POA: Diagnosis not present

## 2022-09-04 DIAGNOSIS — M810 Age-related osteoporosis without current pathological fracture: Secondary | ICD-10-CM | POA: Diagnosis not present

## 2022-09-04 DIAGNOSIS — I10 Essential (primary) hypertension: Secondary | ICD-10-CM | POA: Diagnosis not present

## 2022-09-04 DIAGNOSIS — J45909 Unspecified asthma, uncomplicated: Secondary | ICD-10-CM | POA: Diagnosis not present

## 2022-09-04 DIAGNOSIS — E7800 Pure hypercholesterolemia, unspecified: Secondary | ICD-10-CM | POA: Diagnosis not present

## 2022-09-04 DIAGNOSIS — D692 Other nonthrombocytopenic purpura: Secondary | ICD-10-CM | POA: Diagnosis not present

## 2022-09-04 DIAGNOSIS — Z139 Encounter for screening, unspecified: Secondary | ICD-10-CM | POA: Diagnosis not present

## 2022-09-04 DIAGNOSIS — E559 Vitamin D deficiency, unspecified: Secondary | ICD-10-CM | POA: Diagnosis not present

## 2022-11-07 DIAGNOSIS — Z0000 Encounter for general adult medical examination without abnormal findings: Secondary | ICD-10-CM | POA: Diagnosis not present

## 2022-11-07 DIAGNOSIS — Z9181 History of falling: Secondary | ICD-10-CM | POA: Diagnosis not present

## 2022-11-07 DIAGNOSIS — Z139 Encounter for screening, unspecified: Secondary | ICD-10-CM | POA: Diagnosis not present

## 2022-11-07 DIAGNOSIS — Z1331 Encounter for screening for depression: Secondary | ICD-10-CM | POA: Diagnosis not present

## 2022-11-07 DIAGNOSIS — Z1211 Encounter for screening for malignant neoplasm of colon: Secondary | ICD-10-CM | POA: Diagnosis not present

## 2023-02-27 ENCOUNTER — Other Ambulatory Visit: Payer: Self-pay | Admitting: Obstetrics

## 2023-02-27 DIAGNOSIS — Z0000 Encounter for general adult medical examination without abnormal findings: Secondary | ICD-10-CM

## 2023-03-10 DIAGNOSIS — Z79899 Other long term (current) drug therapy: Secondary | ICD-10-CM | POA: Diagnosis not present

## 2023-03-10 DIAGNOSIS — E559 Vitamin D deficiency, unspecified: Secondary | ICD-10-CM | POA: Diagnosis not present

## 2023-03-10 DIAGNOSIS — E7800 Pure hypercholesterolemia, unspecified: Secondary | ICD-10-CM | POA: Diagnosis not present

## 2023-03-10 DIAGNOSIS — I10 Essential (primary) hypertension: Secondary | ICD-10-CM | POA: Diagnosis not present

## 2023-03-12 DIAGNOSIS — J45909 Unspecified asthma, uncomplicated: Secondary | ICD-10-CM | POA: Diagnosis not present

## 2023-03-12 DIAGNOSIS — R300 Dysuria: Secondary | ICD-10-CM | POA: Diagnosis not present

## 2023-03-12 DIAGNOSIS — D692 Other nonthrombocytopenic purpura: Secondary | ICD-10-CM | POA: Diagnosis not present

## 2023-03-12 DIAGNOSIS — E559 Vitamin D deficiency, unspecified: Secondary | ICD-10-CM | POA: Diagnosis not present

## 2023-03-12 DIAGNOSIS — M810 Age-related osteoporosis without current pathological fracture: Secondary | ICD-10-CM | POA: Diagnosis not present

## 2023-03-12 DIAGNOSIS — I10 Essential (primary) hypertension: Secondary | ICD-10-CM | POA: Diagnosis not present

## 2023-03-12 DIAGNOSIS — Z23 Encounter for immunization: Secondary | ICD-10-CM | POA: Diagnosis not present

## 2023-03-12 DIAGNOSIS — K219 Gastro-esophageal reflux disease without esophagitis: Secondary | ICD-10-CM | POA: Diagnosis not present

## 2023-03-12 DIAGNOSIS — E7800 Pure hypercholesterolemia, unspecified: Secondary | ICD-10-CM | POA: Diagnosis not present

## 2023-03-13 ENCOUNTER — Ambulatory Visit
Admission: RE | Admit: 2023-03-13 | Discharge: 2023-03-13 | Disposition: A | Discharge status: Home / Self Care | Payer: Medicare PPO | Source: Ambulatory Visit | Attending: Obstetrics | Admitting: Obstetrics

## 2023-03-13 DIAGNOSIS — N958 Other specified menopausal and perimenopausal disorders: Secondary | ICD-10-CM | POA: Diagnosis not present

## 2023-03-13 DIAGNOSIS — M810 Age-related osteoporosis without current pathological fracture: Secondary | ICD-10-CM

## 2023-03-13 DIAGNOSIS — M8588 Other specified disorders of bone density and structure, other site: Secondary | ICD-10-CM | POA: Diagnosis not present

## 2023-03-13 DIAGNOSIS — E2839 Other primary ovarian failure: Secondary | ICD-10-CM | POA: Diagnosis not present

## 2023-03-17 DIAGNOSIS — M810 Age-related osteoporosis without current pathological fracture: Secondary | ICD-10-CM | POA: Diagnosis not present

## 2023-03-17 DIAGNOSIS — K219 Gastro-esophageal reflux disease without esophagitis: Secondary | ICD-10-CM | POA: Diagnosis not present

## 2023-03-18 ENCOUNTER — Other Ambulatory Visit: Payer: Self-pay | Admitting: Obstetrics

## 2023-03-21 ENCOUNTER — Encounter (HOSPITAL_COMMUNITY): Payer: Medicare PPO

## 2023-03-24 ENCOUNTER — Other Ambulatory Visit (HOSPITAL_COMMUNITY): Payer: Self-pay | Admitting: *Deleted

## 2023-03-24 DIAGNOSIS — J069 Acute upper respiratory infection, unspecified: Secondary | ICD-10-CM | POA: Diagnosis not present

## 2023-03-24 DIAGNOSIS — J45901 Unspecified asthma with (acute) exacerbation: Secondary | ICD-10-CM | POA: Diagnosis not present

## 2023-03-25 ENCOUNTER — Ambulatory Visit (HOSPITAL_COMMUNITY)
Admission: RE | Admit: 2023-03-25 | Discharge: 2023-03-25 | Disposition: A | Discharge status: Home / Self Care | Payer: Medicare PPO | Source: Ambulatory Visit | Attending: Obstetrics | Admitting: Obstetrics

## 2023-03-25 DIAGNOSIS — M810 Age-related osteoporosis without current pathological fracture: Secondary | ICD-10-CM | POA: Insufficient documentation

## 2023-03-25 MED ORDER — ZOLEDRONIC ACID 5 MG/100ML IV SOLN
INTRAVENOUS | Status: AC
  Administered 2023-03-25: 5 mg via INTRAVENOUS
  Filled 2023-03-25: qty 100

## 2023-03-25 MED ORDER — ZOLEDRONIC ACID 5 MG/100ML IV SOLN: 5.0000 mg | Freq: Once | INTRAVENOUS | Status: AC

## 2023-03-31 ENCOUNTER — Ambulatory Visit
Admission: RE | Admit: 2023-03-31 | Discharge: 2023-03-31 | Disposition: A | Discharge status: Home / Self Care | Payer: Medicare PPO | Source: Ambulatory Visit | Attending: Obstetrics | Admitting: Obstetrics

## 2023-03-31 DIAGNOSIS — Z1231 Encounter for screening mammogram for malignant neoplasm of breast: Secondary | ICD-10-CM | POA: Diagnosis not present

## 2023-03-31 DIAGNOSIS — Z0000 Encounter for general adult medical examination without abnormal findings: Secondary | ICD-10-CM

## 2023-04-30 DIAGNOSIS — M1612 Unilateral primary osteoarthritis, left hip: Secondary | ICD-10-CM | POA: Diagnosis not present

## 2023-04-30 DIAGNOSIS — M25562 Pain in left knee: Secondary | ICD-10-CM | POA: Diagnosis not present

## 2023-04-30 DIAGNOSIS — M25561 Pain in right knee: Secondary | ICD-10-CM | POA: Diagnosis not present

## 2023-05-28 DIAGNOSIS — M1612 Unilateral primary osteoarthritis, left hip: Secondary | ICD-10-CM | POA: Diagnosis not present

## 2023-05-28 DIAGNOSIS — M1712 Unilateral primary osteoarthritis, left knee: Secondary | ICD-10-CM | POA: Diagnosis not present

## 2023-06-12 DIAGNOSIS — M25562 Pain in left knee: Secondary | ICD-10-CM | POA: Diagnosis not present

## 2023-06-23 DIAGNOSIS — J3089 Other allergic rhinitis: Secondary | ICD-10-CM | POA: Diagnosis not present

## 2023-06-23 DIAGNOSIS — J3081 Allergic rhinitis due to animal (cat) (dog) hair and dander: Secondary | ICD-10-CM | POA: Diagnosis not present

## 2023-06-23 DIAGNOSIS — H1045 Other chronic allergic conjunctivitis: Secondary | ICD-10-CM | POA: Diagnosis not present

## 2023-06-23 DIAGNOSIS — J4540 Moderate persistent asthma, uncomplicated: Secondary | ICD-10-CM | POA: Diagnosis not present

## 2023-06-25 DIAGNOSIS — M25562 Pain in left knee: Secondary | ICD-10-CM | POA: Diagnosis not present

## 2023-06-27 DIAGNOSIS — M6281 Muscle weakness (generalized): Secondary | ICD-10-CM | POA: Diagnosis not present

## 2023-06-27 DIAGNOSIS — M25562 Pain in left knee: Secondary | ICD-10-CM | POA: Diagnosis not present

## 2023-06-30 DIAGNOSIS — M6281 Muscle weakness (generalized): Secondary | ICD-10-CM | POA: Diagnosis not present

## 2023-06-30 DIAGNOSIS — M25562 Pain in left knee: Secondary | ICD-10-CM | POA: Diagnosis not present

## 2023-07-03 DIAGNOSIS — M25562 Pain in left knee: Secondary | ICD-10-CM | POA: Diagnosis not present

## 2023-07-03 DIAGNOSIS — M6281 Muscle weakness (generalized): Secondary | ICD-10-CM | POA: Diagnosis not present

## 2023-07-08 DIAGNOSIS — M6281 Muscle weakness (generalized): Secondary | ICD-10-CM | POA: Diagnosis not present

## 2023-07-08 DIAGNOSIS — M25562 Pain in left knee: Secondary | ICD-10-CM | POA: Diagnosis not present

## 2023-07-10 DIAGNOSIS — M6281 Muscle weakness (generalized): Secondary | ICD-10-CM | POA: Diagnosis not present

## 2023-07-10 DIAGNOSIS — M25562 Pain in left knee: Secondary | ICD-10-CM | POA: Diagnosis not present

## 2023-07-15 DIAGNOSIS — M25562 Pain in left knee: Secondary | ICD-10-CM | POA: Diagnosis not present

## 2023-07-15 DIAGNOSIS — M6281 Muscle weakness (generalized): Secondary | ICD-10-CM | POA: Diagnosis not present

## 2023-07-17 DIAGNOSIS — M6281 Muscle weakness (generalized): Secondary | ICD-10-CM | POA: Diagnosis not present

## 2023-07-17 DIAGNOSIS — M25562 Pain in left knee: Secondary | ICD-10-CM | POA: Diagnosis not present

## 2023-07-22 DIAGNOSIS — M25562 Pain in left knee: Secondary | ICD-10-CM | POA: Diagnosis not present

## 2023-07-22 DIAGNOSIS — M6281 Muscle weakness (generalized): Secondary | ICD-10-CM | POA: Diagnosis not present

## 2023-07-23 DIAGNOSIS — M25562 Pain in left knee: Secondary | ICD-10-CM | POA: Diagnosis not present

## 2023-07-23 DIAGNOSIS — M6281 Muscle weakness (generalized): Secondary | ICD-10-CM | POA: Diagnosis not present

## 2023-07-25 DIAGNOSIS — I10 Essential (primary) hypertension: Secondary | ICD-10-CM | POA: Diagnosis not present

## 2023-07-25 DIAGNOSIS — N390 Urinary tract infection, site not specified: Secondary | ICD-10-CM | POA: Diagnosis not present

## 2023-07-29 DIAGNOSIS — M25562 Pain in left knee: Secondary | ICD-10-CM | POA: Diagnosis not present

## 2023-07-29 DIAGNOSIS — M6281 Muscle weakness (generalized): Secondary | ICD-10-CM | POA: Diagnosis not present

## 2023-07-31 DIAGNOSIS — M25562 Pain in left knee: Secondary | ICD-10-CM | POA: Diagnosis not present

## 2023-07-31 DIAGNOSIS — M6281 Muscle weakness (generalized): Secondary | ICD-10-CM | POA: Diagnosis not present

## 2023-08-05 DIAGNOSIS — M25562 Pain in left knee: Secondary | ICD-10-CM | POA: Diagnosis not present

## 2023-08-05 DIAGNOSIS — M6281 Muscle weakness (generalized): Secondary | ICD-10-CM | POA: Diagnosis not present

## 2023-08-06 DIAGNOSIS — M1612 Unilateral primary osteoarthritis, left hip: Secondary | ICD-10-CM | POA: Diagnosis not present

## 2023-08-06 DIAGNOSIS — M25562 Pain in left knee: Secondary | ICD-10-CM | POA: Diagnosis not present

## 2023-08-07 DIAGNOSIS — M6281 Muscle weakness (generalized): Secondary | ICD-10-CM | POA: Diagnosis not present

## 2023-08-07 DIAGNOSIS — M25562 Pain in left knee: Secondary | ICD-10-CM | POA: Diagnosis not present

## 2023-08-11 DIAGNOSIS — M6281 Muscle weakness (generalized): Secondary | ICD-10-CM | POA: Diagnosis not present

## 2023-08-11 DIAGNOSIS — M25562 Pain in left knee: Secondary | ICD-10-CM | POA: Diagnosis not present

## 2023-08-12 DIAGNOSIS — M25552 Pain in left hip: Secondary | ICD-10-CM | POA: Diagnosis not present

## 2023-08-13 DIAGNOSIS — M25562 Pain in left knee: Secondary | ICD-10-CM | POA: Diagnosis not present

## 2023-08-13 DIAGNOSIS — M6281 Muscle weakness (generalized): Secondary | ICD-10-CM | POA: Diagnosis not present

## 2023-08-18 DIAGNOSIS — M25562 Pain in left knee: Secondary | ICD-10-CM | POA: Diagnosis not present

## 2023-08-18 DIAGNOSIS — M6281 Muscle weakness (generalized): Secondary | ICD-10-CM | POA: Diagnosis not present

## 2023-08-20 DIAGNOSIS — M25562 Pain in left knee: Secondary | ICD-10-CM | POA: Diagnosis not present

## 2023-08-20 DIAGNOSIS — M6281 Muscle weakness (generalized): Secondary | ICD-10-CM | POA: Diagnosis not present

## 2023-08-25 DIAGNOSIS — M6281 Muscle weakness (generalized): Secondary | ICD-10-CM | POA: Diagnosis not present

## 2023-08-25 DIAGNOSIS — M25562 Pain in left knee: Secondary | ICD-10-CM | POA: Diagnosis not present

## 2023-08-27 DIAGNOSIS — M25562 Pain in left knee: Secondary | ICD-10-CM | POA: Diagnosis not present

## 2023-08-27 DIAGNOSIS — M6281 Muscle weakness (generalized): Secondary | ICD-10-CM | POA: Diagnosis not present

## 2023-09-01 DIAGNOSIS — M87052 Idiopathic aseptic necrosis of left femur: Secondary | ICD-10-CM | POA: Diagnosis not present

## 2023-09-04 DIAGNOSIS — Z79899 Other long term (current) drug therapy: Secondary | ICD-10-CM | POA: Diagnosis not present

## 2023-09-04 DIAGNOSIS — E7800 Pure hypercholesterolemia, unspecified: Secondary | ICD-10-CM | POA: Diagnosis not present

## 2023-09-04 DIAGNOSIS — I10 Essential (primary) hypertension: Secondary | ICD-10-CM | POA: Diagnosis not present

## 2023-09-04 DIAGNOSIS — E559 Vitamin D deficiency, unspecified: Secondary | ICD-10-CM | POA: Diagnosis not present

## 2023-09-12 DIAGNOSIS — D692 Other nonthrombocytopenic purpura: Secondary | ICD-10-CM | POA: Diagnosis not present

## 2023-09-12 DIAGNOSIS — J45909 Unspecified asthma, uncomplicated: Secondary | ICD-10-CM | POA: Diagnosis not present

## 2023-09-12 DIAGNOSIS — K219 Gastro-esophageal reflux disease without esophagitis: Secondary | ICD-10-CM | POA: Diagnosis not present

## 2023-09-12 DIAGNOSIS — E559 Vitamin D deficiency, unspecified: Secondary | ICD-10-CM | POA: Diagnosis not present

## 2023-09-12 DIAGNOSIS — M810 Age-related osteoporosis without current pathological fracture: Secondary | ICD-10-CM | POA: Diagnosis not present

## 2023-09-12 DIAGNOSIS — I10 Essential (primary) hypertension: Secondary | ICD-10-CM | POA: Diagnosis not present

## 2023-09-12 DIAGNOSIS — E871 Hypo-osmolality and hyponatremia: Secondary | ICD-10-CM | POA: Diagnosis not present

## 2023-09-12 DIAGNOSIS — E7800 Pure hypercholesterolemia, unspecified: Secondary | ICD-10-CM | POA: Diagnosis not present

## 2023-09-29 DIAGNOSIS — M1612 Unilateral primary osteoarthritis, left hip: Secondary | ICD-10-CM | POA: Diagnosis not present

## 2023-10-01 ENCOUNTER — Observation Stay (HOSPITAL_COMMUNITY)
Admission: EM | Admit: 2023-10-01 | Discharge: 2023-10-02 | Disposition: A | Discharge status: Home / Self Care | Attending: Internal Medicine | Admitting: Internal Medicine

## 2023-10-01 ENCOUNTER — Emergency Department (HOSPITAL_COMMUNITY)

## 2023-10-01 ENCOUNTER — Other Ambulatory Visit: Payer: Self-pay

## 2023-10-01 DIAGNOSIS — E785 Hyperlipidemia, unspecified: Secondary | ICD-10-CM | POA: Insufficient documentation

## 2023-10-01 DIAGNOSIS — E871 Hypo-osmolality and hyponatremia: Secondary | ICD-10-CM | POA: Diagnosis not present

## 2023-10-01 DIAGNOSIS — I10 Essential (primary) hypertension: Secondary | ICD-10-CM | POA: Diagnosis not present

## 2023-10-01 DIAGNOSIS — R410 Disorientation, unspecified: Secondary | ICD-10-CM | POA: Diagnosis not present

## 2023-10-01 DIAGNOSIS — M069 Rheumatoid arthritis, unspecified: Secondary | ICD-10-CM | POA: Diagnosis not present

## 2023-10-01 DIAGNOSIS — R569 Unspecified convulsions: Secondary | ICD-10-CM | POA: Diagnosis not present

## 2023-10-01 DIAGNOSIS — R0683 Snoring: Secondary | ICD-10-CM | POA: Insufficient documentation

## 2023-10-01 DIAGNOSIS — G459 Transient cerebral ischemic attack, unspecified: Secondary | ICD-10-CM | POA: Diagnosis not present

## 2023-10-01 DIAGNOSIS — G4730 Sleep apnea, unspecified: Secondary | ICD-10-CM | POA: Diagnosis not present

## 2023-10-01 DIAGNOSIS — Z7982 Long term (current) use of aspirin: Secondary | ICD-10-CM | POA: Diagnosis not present

## 2023-10-01 DIAGNOSIS — Z79899 Other long term (current) drug therapy: Secondary | ICD-10-CM | POA: Diagnosis not present

## 2023-10-01 DIAGNOSIS — K219 Gastro-esophageal reflux disease without esophagitis: Secondary | ICD-10-CM | POA: Insufficient documentation

## 2023-10-01 DIAGNOSIS — R4182 Altered mental status, unspecified: Secondary | ICD-10-CM | POA: Diagnosis present

## 2023-10-01 DIAGNOSIS — R4701 Aphasia: Secondary | ICD-10-CM | POA: Insufficient documentation

## 2023-10-01 DIAGNOSIS — M25559 Pain in unspecified hip: Secondary | ICD-10-CM | POA: Insufficient documentation

## 2023-10-01 DIAGNOSIS — I6523 Occlusion and stenosis of bilateral carotid arteries: Secondary | ICD-10-CM | POA: Diagnosis not present

## 2023-10-01 DIAGNOSIS — J45909 Unspecified asthma, uncomplicated: Secondary | ICD-10-CM | POA: Diagnosis not present

## 2023-10-01 LAB — COMPREHENSIVE METABOLIC PANEL WITH GFR
ALT: 16 U/L (ref 0–44)
AST: 20 U/L (ref 15–41)
Albumin: 4.2 g/dL (ref 3.5–5.0)
Alkaline Phosphatase: 68 U/L (ref 38–126)
Anion gap: 11 (ref 5–15)
BUN: 9 mg/dL (ref 8–23)
CO2: 24 mmol/L (ref 22–32)
Calcium: 9.3 mg/dL (ref 8.9–10.3)
Chloride: 94 mmol/L — ABNORMAL LOW (ref 98–111)
Creatinine, Ser: 0.38 mg/dL — ABNORMAL LOW (ref 0.44–1.00)
GFR, Estimated: >60 mL/min (ref >60)
Glucose, Bld: 111 mg/dL — ABNORMAL HIGH (ref 70–99)
Potassium: 4 mmol/L (ref 3.5–5.1)
Sodium: 129 mmol/L — ABNORMAL LOW (ref 135–145)
Total Bilirubin: 0.5 mg/dL (ref 0.0–1.2)
Total Protein: 7.3 g/dL (ref 6.5–8.1)

## 2023-10-01 LAB — URINALYSIS, ROUTINE W REFLEX MICROSCOPIC
Bilirubin Urine: NEGATIVE
Glucose, UA: NEGATIVE mg/dL
Hgb urine dipstick: NEGATIVE
Ketones, ur: 5 mg/dL — AB
Leukocytes,Ua: NEGATIVE
Nitrite: NEGATIVE
Protein, ur: NEGATIVE mg/dL
Specific Gravity, Urine: 1.011 (ref 1.005–1.030)
pH: 7 (ref 5.0–8.0)

## 2023-10-01 LAB — CBC
HCT: 38.1 % (ref 36.0–46.0)
Hemoglobin: 12.5 g/dL (ref 12.0–15.0)
MCH: 29.1 pg (ref 26.0–34.0)
MCHC: 32.8 g/dL (ref 30.0–36.0)
MCV: 88.8 fL (ref 80.0–100.0)
Platelets: 316 K/uL (ref 150–400)
RBC: 4.29 MIL/uL (ref 3.87–5.11)
RDW: 13.2 % (ref 11.5–15.5)
WBC: 8.3 K/uL (ref 4.0–10.5)
nRBC: 0 % (ref 0.0–0.2)

## 2023-10-01 LAB — DIFFERENTIAL
Abs Immature Granulocytes: 0.03 K/uL (ref 0.00–0.07)
Basophils Absolute: 0.1 K/uL (ref 0.0–0.1)
Basophils Relative: 1 %
Eosinophils Absolute: 0.1 K/uL (ref 0.0–0.5)
Eosinophils Relative: 1 %
Immature Granulocytes: 0 %
Lymphocytes Relative: 22 %
Lymphs Abs: 1.8 K/uL (ref 0.7–4.0)
Monocytes Absolute: 0.7 K/uL (ref 0.1–1.0)
Monocytes Relative: 9 %
Neutro Abs: 5.6 K/uL (ref 1.7–7.7)
Neutrophils Relative %: 67 %

## 2023-10-01 LAB — CBG MONITORING, ED: Glucose-Capillary: 109 mg/dL — ABNORMAL HIGH (ref 70–99)

## 2023-10-01 LAB — APTT: aPTT: 26 s (ref 24–36)

## 2023-10-01 LAB — PROTIME-INR
INR: 0.9 (ref 0.8–1.2)
Prothrombin Time: 13.1 s (ref 11.4–15.2)

## 2023-10-01 MED ORDER — ADULT MULTIVITAMIN W/MINERALS CH
1.0000 | ORAL_TABLET | ORAL | Status: DC
  Administered 2023-10-02: 1 via ORAL
  Filled 2023-10-01: qty 1

## 2023-10-01 MED ORDER — MONTELUKAST SODIUM 10 MG PO TABS
10.0000 mg | ORAL_TABLET | Freq: Every day | ORAL | Status: DC
  Administered 2023-10-01: 10 mg via ORAL
  Filled 2023-10-01: qty 1

## 2023-10-01 MED ORDER — SENNOSIDES-DOCUSATE SODIUM 8.6-50 MG PO TABS: 1.0000 | ORAL_TABLET | Freq: Every evening | ORAL | Status: DC | PRN

## 2023-10-01 MED ORDER — FLUTICASONE PROPIONATE 50 MCG/ACT NA SUSP
1.0000 | Freq: Every day | NASAL | Status: DC
  Administered 2023-10-02: 1 via NASAL
  Filled 2023-10-01: qty 16

## 2023-10-01 MED ORDER — ACETAMINOPHEN 160 MG/5ML PO SOLN: 650.0000 mg | ORAL | Status: DC | PRN

## 2023-10-01 MED ORDER — TRAMADOL HCL 50 MG PO TABS
50.0000 mg | ORAL_TABLET | Freq: Three times a day (TID) | ORAL | Status: DC | PRN
  Administered 2023-10-02: 50 mg via ORAL
  Filled 2023-10-01: qty 1

## 2023-10-01 MED ORDER — LORATADINE 10 MG PO TABS
10.0000 mg | ORAL_TABLET | Freq: Every day | ORAL | Status: DC
  Administered 2023-10-02: 10 mg via ORAL
  Filled 2023-10-01: qty 1

## 2023-10-01 MED ORDER — ROSUVASTATIN CALCIUM 5 MG PO TABS
10.0000 mg | ORAL_TABLET | Freq: Every day | ORAL | Status: DC
  Administered 2023-10-01: 10 mg via ORAL
  Filled 2023-10-01: qty 1

## 2023-10-01 MED ORDER — LISINOPRIL 20 MG PO TABS
40.0000 mg | ORAL_TABLET | Freq: Every day | ORAL | Status: DC
  Administered 2023-10-02: 40 mg via ORAL
  Filled 2023-10-01: qty 2

## 2023-10-01 MED ORDER — ASPIRIN 81 MG PO CHEW
324.0000 mg | CHEWABLE_TABLET | Freq: Once | ORAL | Status: AC
  Administered 2023-10-01: 324 mg via ORAL
  Filled 2023-10-01: qty 4

## 2023-10-01 MED ORDER — IOHEXOL 350 MG/ML SOLN
75.0000 mL | Freq: Once | INTRAVENOUS | Status: AC | PRN
  Administered 2023-10-01: 75 mL via INTRAVENOUS

## 2023-10-01 MED ORDER — FLUTICASONE FUROATE-VILANTEROL 100-25 MCG/ACT IN AEPB
1.0000 | INHALATION_SPRAY | Freq: Every day | RESPIRATORY_TRACT | Status: DC
  Administered 2023-10-02: 1 via RESPIRATORY_TRACT
  Filled 2023-10-01 (×2): qty 28

## 2023-10-01 MED ORDER — CLOPIDOGREL BISULFATE 300 MG PO TABS
300.0000 mg | ORAL_TABLET | Freq: Once | ORAL | Status: AC
  Administered 2023-10-01: 300 mg via ORAL
  Filled 2023-10-01: qty 1

## 2023-10-01 MED ORDER — ACETAMINOPHEN 325 MG PO TABS
650.0000 mg | ORAL_TABLET | ORAL | Status: DC | PRN
  Administered 2023-10-01: 650 mg via ORAL
  Filled 2023-10-01: qty 2

## 2023-10-01 MED ORDER — VITAMIN D 25 MCG (1000 UNIT) PO TABS
2000.0000 [IU] | ORAL_TABLET | Freq: Every day | ORAL | Status: DC
  Administered 2023-10-02: 2000 [IU] via ORAL
  Filled 2023-10-01: qty 2

## 2023-10-01 MED ORDER — FENTANYL CITRATE PF 50 MCG/ML IJ SOSY
25.0000 ug | PREFILLED_SYRINGE | Freq: Once | INTRAMUSCULAR | Status: AC
  Administered 2023-10-01: 25 ug via INTRAVENOUS
  Filled 2023-10-01: qty 1

## 2023-10-01 MED ORDER — ENOXAPARIN SODIUM 40 MG/0.4ML IJ SOSY
40.0000 mg | PREFILLED_SYRINGE | INTRAMUSCULAR | Status: DC
  Administered 2023-10-01: 40 mg via SUBCUTANEOUS
  Filled 2023-10-01: qty 0.4

## 2023-10-01 MED ORDER — PANTOPRAZOLE SODIUM 40 MG PO TBEC
40.0000 mg | DELAYED_RELEASE_TABLET | Freq: Every day | ORAL | Status: DC
  Administered 2023-10-02: 40 mg via ORAL
  Filled 2023-10-01: qty 1

## 2023-10-01 MED ORDER — SODIUM CHLORIDE 0.9 % IV SOLN: INTRAVENOUS | Status: DC

## 2023-10-01 MED ORDER — IBUPROFEN 600 MG PO TABS
600.0000 mg | ORAL_TABLET | Freq: Three times a day (TID) | ORAL | Status: DC
  Filled 2023-10-01: qty 3

## 2023-10-01 MED ORDER — ACETAMINOPHEN 650 MG RE SUPP: 650.0000 mg | RECTAL | Status: DC | PRN

## 2023-10-01 MED ORDER — STROKE: EARLY STAGES OF RECOVERY BOOK
Freq: Once | Status: AC
  Filled 2023-10-01: qty 1

## 2023-10-01 MED ORDER — SODIUM CHLORIDE (PF) 0.9 % IJ SOLN
INTRAMUSCULAR | Status: AC
  Filled 2023-10-01: qty 50

## 2023-10-01 NOTE — ED Provider Notes (Signed)
 [place] EMERGENCY DEPARTMENT AT [facility] Provider Note   CSN: [id number] Arrival date & time: 10/01/23  1431     Patient presents with: Altered Mental Status   Cassandra Spears is a 73 y.o. female.    Patient with history of asthma, hip pain with hip replacement planned later this month, on tramadol , history of hypertension on lisinopril  --presents to the emergency department today with husband for evaluation of altered mental status and speech change.  Symptoms started at about 11 PM last night.  Patient's husband at bedside, gives additional history.  He states that the patient woke her up around this time.  Patient was tearful, had repetitive questioning, repeatedly saying I do not know what I am supposed to do.  I do not know where to get off.  She cannot answer basic questions as to where she was, her birthday, her name.  He was able to get her into bed and sleeping for a little bit however she woke up multiple times to go to the restroom.  She was still having difficulty with speech.  Patient remembers not able to get words out and was aware of her speech problem.  She gradually became more coherent by around 8 AM.  She was still having difficulty completing some sentences at that time.  Patient did not take her typical nighttime medications and morning medications.  Last took tramadol  yesterday morning.  Recent increase in lisinopril  couple of weeks ago, otherwise no medication changes.  No fevers or URI symptoms. Patient denies other signs of stroke including: facial droop, slurred speech, weakness/numbness in extremities, imbalance/trouble walking. She has not had these symptoms before.         Prior to Admission medications   Medication Sig Start Date End Date Taking? Authorizing Provider  albuterol (PROVENTIL) (2.5 MG/3ML) 0.083% nebulizer solution Take 2.5 mg by nebulization every 6 (six) hours as needed for wheezing or shortness of breath.    Provider,  Historical, Cassandra Spears  alendronate (FOSAMAX) 70 MG tablet TK 1 T PO 1 TIME A WK 09/06/16   Provider, Historical, Cassandra Spears  Ascorbic Acid (VITAMIN C) 1000 MG tablet Take 1,000 mg by mouth 2 (two) times daily.    Provider, Historical, Cassandra Spears  aspirin  EC 81 MG tablet Take 81 mg by mouth daily.    Provider, Historical, Cassandra Spears  Azelaic Acid (FINACEA) 15 % cream Apply topically 2 (two) times daily. After skin is thoroughly washed and patted dry, gently but thoroughly massage a thin film of azelaic acid cream into the affected area twice daily, in the morning and evening.    Provider, Historical, Cassandra Spears  betamethasone dipropionate (DIPROLENE) 0.05 % cream Apply topically 2 (two) times daily.    Provider, Historical, Cassandra Spears  BREO ELLIPTA  100-25 MCG/INH AEPB INL 1 PUFF PO QD 09/06/16   Provider, Historical, Cassandra Spears  cholecalciferol  (VITAMIN D ) 1000 units tablet Take 1,000 Units by mouth 2 (two) times daily.    Provider, Historical, Cassandra Spears  esomeprazole (NEXIUM) 20 MG capsule TK 1 C PO D 09/06/16   Provider, Historical, Cassandra Spears  estradiol (ESTRACE) 0.1 MG/GM vaginal cream Place 1 Applicatorful vaginally at bedtime.    Provider, Historical, Cassandra Spears  ferrous sulfate 325 (65 FE) MG tablet Take 325 mg by mouth 2 (two) times daily.    Provider, Historical, Cassandra Spears  fexofenadine (ALLEGRA) 180 MG tablet Take 180 mg by mouth daily.    Provider, Historical, Cassandra Spears  lisinopril  (PRINIVIL ,ZESTRIL ) 10 MG tablet Take 10 mg by mouth daily.  Provider, Historical, Cassandra Spears  mometasone (NASONEX) 50 MCG/ACT nasal spray SHAKE LQ AND U 2 SPRAYS IEN QD 09/17/16   Provider, Historical, Cassandra Spears  montelukast  (SINGULAIR ) 10 MG tablet Take 10 mg by mouth at bedtime.    Provider, Historical, Cassandra Spears  OVER THE COUNTER MEDICATION Fish Oil 350 mg. One time daily.    Provider, Historical, Cassandra Spears  OVER THE COUNTER MEDICATION Calcium  Citrate/ vitamin D3/ and Magnesium 4000 mg. Taken 2 times daily.    Provider, Historical, Cassandra Spears  OVER THE COUNTER MEDICATION Citrucel 2 g daily.    Provider, Historical, Cassandra Spears  OVER THE COUNTER  MEDICATION Phillips Colon Health Probiotic Capsule daily.    Provider, Historical, Cassandra Spears  vitamin B-12 (CYANOCOBALAMIN ) 500 MCG tablet Take 500 mcg by mouth daily.    Provider, Historical, Cassandra Spears  ZADITOR 0.025 % ophthalmic solution INSTILL 1 DROP INTO AFFECTED EYE BID PRN 06/19/16   Provider, Historical, Cassandra Spears    Allergies: Patient has no known allergies.    Review of Systems  Updated Vital Signs BP (!) 182/91 (BP Location: Left Arm)   Pulse 88   Temp 98.3 F (36.8 C) (Oral)   Resp 16   SpO2 100%   Physical Exam Vitals and nursing note reviewed.  Constitutional:      Appearance: She is well-developed.  HENT:     Head: Normocephalic and atraumatic.     Right Ear: Tympanic membrane, ear canal and external ear normal.     Left Ear: Tympanic membrane, ear canal and external ear normal.     Nose: Nose normal.     Mouth/Throat:     Mouth: Mucous membranes are moist.     Pharynx: Uvula midline.  Eyes:     General: Lids are normal.     Extraocular Movements:     Right eye: No nystagmus.     Left eye: No nystagmus.     Conjunctiva/sclera: Conjunctivae normal.     Pupils: Pupils are equal, round, and reactive to light.  Cardiovascular:     Rate and Rhythm: Normal rate and regular rhythm.  Pulmonary:     Effort: Pulmonary effort is normal.     Breath sounds: Normal breath sounds.  Abdominal:     Palpations: Abdomen is soft.     Tenderness: There is no abdominal tenderness.  Musculoskeletal:     Cervical back: Normal range of motion and neck supple. No tenderness or bony tenderness.  Skin:    General: Skin is warm and dry.  Neurological:     Mental Status: She is alert and oriented to person, place, and time.     GCS: GCS eye subscore is 4. GCS verbal subscore is 5. GCS motor subscore is 6.     Cranial Nerves: No cranial nerve deficit, dysarthria or facial asymmetry.     Sensory: No sensory deficit.     Motor: No weakness.     Coordination: Coordination normal.     Comments: Upper  extremity myotomes tested bilaterally:  C5 Shoulder abduction 5/5 C6 Elbow flexion/wrist extension 5/5 C7 Elbow extension 5/5 C8 Finger flexion 5/5 T1 Finger abduction 5/5  Lower extremity myotomes tested bilaterally: L2 Hip flexion 5/5 L3 Knee extension 5/5 L4 Ankle dorsiflexion 5/5 S1 Ankle plantar flexion 5/5      (all labs ordered are listed, but only abnormal results are displayed) Labs Reviewed  COMPREHENSIVE METABOLIC PANEL WITH GFR - Abnormal; Notable for the following components:      Result Value   Sodium 129 (*)  Chloride 94 (*)    Glucose, Bld 111 (*)    Creatinine, Ser 0.38 (*)    All other components within normal limits  URINALYSIS, ROUTINE W REFLEX MICROSCOPIC - Abnormal; Notable for the following components:   Ketones, ur 5 (*)    All other components within normal limits  CBG MONITORING, ED - Abnormal; Notable for the following components:   Glucose-Capillary 109 (*)    All other components within normal limits  CBC  PROTIME-INR  APTT  DIFFERENTIAL    EKG: EKG Interpretation Date/Time:  Wednesday October 01 2023 15:16:36 EDT Ventricular Rate:  87 PR Interval:  153 QRS Duration:  89 QT Interval:  378 QTC Calculation: 455 R Axis:   76  Text Interpretation: Sinus rhythm Right atrial enlargement Confirmed by Cassandra Spears) on 10/01/2023 3:29:15 PM  Radiology: CT ANGIO HEAD NECK W WO CM Result Date: 10/01/2023 EXAM: CTA Head and Neck with Intravenous Contrast. CT Head without Contrast. CLINICAL HISTORY: Transient ischemic attack (TIA); confusion, expressive aphasia. TECHNIQUE: Axial CTA images of the head and neck performed with intravenous contrast. MIP reconstructed images were created and reviewed. Axial computed tomography images of the head/brain performed without intravenous contrast. Note: Per PQRS, the description of internal carotid artery percent stenosis, including 0 percent or normal exam, is based on [place] American Symptomatic Carotid  Endarterectomy Trial (NASCET) criteria. Dose reduction technique was used including one or more of the following: automated exposure control, adjustment of mA and kV according to patient size, and/or iterative reconstruction. CONTRAST: With; 75 mL iohexol  (OMNIPAQUE ) 350 mg/ml injection. COMPARISON: CT Head dated 11/14/2005. FINDINGS: CT HEAD: BRAIN: No acute intraparenchymal hemorrhage. No mass lesion. No CT evidence for acute territorial infarct. No midline shift or extra-axial collection. VENTRICLES: No hydrocephalus. ORBITS: The orbits are unremarkable. SINUSES AND MASTOIDS: The paranasal sinuses and mastoid air cells are clear. CTA NECK: COMMON CAROTID ARTERIES: No significant stenosis. No dissection or occlusion. INTERNAL CAROTID ARTERIES: Moderate circumferential calcific plaque within the origin of the right internal carotid artery with less than 20% luminal stenosis. Mild-to-moderate calcific plaque within the origin of the left internal carotid artery, also with less than 20% luminal stenosis. An ulcerative plaque arising medially from the superior cervical segment of the left internal carotid artery, seen on image 150 of series 16, measuring approximately 3 x 2.5 mm. VERTEBRAL ARTERIES: No significant stenosis. No dissection or occlusion. CTA HEAD: ANTERIOR CEREBRAL ARTERIES: No significant stenosis. No occlusion. No aneurysm. MIDDLE CEREBRAL ARTERIES: No significant stenosis. No occlusion. No aneurysm. POSTERIOR CEREBRAL ARTERIES: No significant stenosis. No occlusion. No aneurysm. BASILAR ARTERY: No significant stenosis. No occlusion. No aneurysm. OTHER: Mild calcific atheromatous disease within the aortic arch. SOFT TISSUES: No acute finding. No masses or lymphadenopathy. BONES: No acute osseous abnormality. IMPRESSION: 1. 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 Referral to a neurointerventionalist or  vascular surgeon is recommended. 2. No acute intracranial hemorrhage or ischemic change. 3. Moderate circumferential calcific plaque within the origin of the right internal carotid artery with less than 20% luminal stenosis. 4. Mild-to-moderate calcific plaque within the origin of the left internal carotid artery with less than 20% luminal stenosis. Electronically signed by: Cassandra Spears 10/01/2023 06:26 PM EDT RP Workstation: [id number]     Procedures   Medications Ordered in the ED  iohexol  (OMNIPAQUE ) 350 MG/ML injection 75 mL (75 mLs Intravenous Contrast Given 10/01/23 1622)    ED Course  Patient seen and examined. History obtained directly from patient and family member.  Labs/EKG: Ordered CBC with differential, CMP, PT/INR and APTT as part of stroke panel, UA.  Imaging: Ordered CTA head and neck.  Medications/Fluids: None ordered  Most recent vital signs reviewed and are as follows: BP (!) 182/91 (BP Location: Left Arm)   Pulse 88   Temp 98.3 F (36.8 C) (Oral)   Resp 16   SpO2 100%   Initial impression: Patient with confusion and disorientation with some symptoms concerning for expressive aphasia.  Symptoms seem to be mostly resolved at this time.  No abnormalities noted on physical exam.  Patient may need MRI to definitively rule out stroke.   7:17 PM Reassessment performed. Patient appears stable.  No recurrent symptoms.  Labs personally reviewed and interpreted including: CBC unremarkable; CMP hypokalemia, chronic, 129 otherwise unremarkable; PT INR and APTT were normal; UA unremarkable.  Imaging personally visualized and interpreted including: CTA neck and head.  Reviewed pertinent lab work and imaging with patient at bedside. Questions answered.   Most current vital signs reviewed and are as follows: BP (!) 206/94   Pulse 88   Temp 98 F (36.7 C) (Oral)   Resp 15   SpO2 99%   Discussed case with Cassandra Spears who has seen patient.   Discussed ulcerated  plaque noted on CTA with on-call vascular surgeon Cassandra Spears.  He has reviewed images.  Location of the plaque is above where he would be involved as a vascular surgeon.  Advises neuro input after MRI results.  Remainder of disease in the neck is mild.  Patient updated, she is aware MRI will be performed.  8:57 PM Reassessment performed. Patient appears stable.  Unable to get MRI here tonight.  Discussed with neurology, Cassandra Spears, recommends admission to hospital.  Would like bed at [facility] so patient to be seen by the stroke team.  Recommends loading with aspirin  and Plavix  300 mg.  Most current vital signs reviewed and are as follows: BP (!) 163/77   Pulse 96   Temp 98 F (36.7 C) (Oral)   Resp 12   SpO2 100%   Plan: Admit to hospitalist.   9:10 PM Callback was to Cassandra Spears who discussed case with hospitalist.                                    Medical Decision Making Amount and/or Complexity of Data Reviewed Labs: ordered. Radiology: ordered.  Risk OTC drugs. Prescription drug management. Decision regarding hospitalization.   For TIA symptoms during episode of delirium and speech difficulties overnight last night.  Patient currently at baseline.     Final diagnoses:  TIA (transient ischemic attack)    ED Discharge Orders     None          Cassandra Spears, Cassandra Spears 10/01/23 2111    Spears Clarity, Cassandra Spears 10/01/23 2239

## 2023-10-01 NOTE — H&P (Incomplete)
 History and Physical  Cassandra Spears FMW:[id number] DOB: 24-Jun-1950 DOA: 10/01/2023  PCP: Stephanie Charlene CROME, MD   Chief Complaint: Altered mental status, aphasia  HPI: Cassandra Spears is a 73 y.o. female with medical history significant for asthma, HTN, osteoporosis, allergies, GERD, and arthritis who presented to the ED for evaluation of altered mental status and aphasia.  ED Course: Initial vitals show afebrile but hypertensive with SBP in the 160s to 190s. Initial labs significant for sodium 129, glucose 111, normal kidney function, normal CBC, normal PT/INR and UA with no signs of infection. EKG shows sinus rhythm with RAE.  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  Pt received aspirin  325 mg x 1, Plavix  300 mg x 1 and IV fentanyl  25 mcg x 1. Vascular surgery and neurology was consulted for evaluation. TR[facility] was consulted for admission.   Review of Systems: Please see HPI for pertinent positives and negatives. A complete 10 system review of systems are otherwise negative.  Past Medical History:  Diagnosis Date   Allergy    Anemia    Arthritis    Asthma    GERD (gastroesophageal reflux disease)    Heart murmur    Hypertension    Osteoporosis    Past Surgical History:  Procedure Laterality Date   BREAST BIOPSY Right 2002   Broken Ankle Right    Uterine Polyp     Social History:  reports that she has never smoked. She has never used smokeless tobacco. She reports current alcohol use. She reports that she does not use drugs.  No Known Allergies  Family History  Problem Relation Age of Onset   Colon cancer Neg Hx    Esophageal cancer Neg Hx    Pancreatic cancer Neg Hx    Rectal cancer Neg Hx    Stomach cancer Neg Hx      Prior to Admission medications   Medication Sig Start Date End Date Taking? Authorizing Provider  albuterol (PROVENTIL) (2.5 MG/3ML) 0.083% nebulizer solution Take  2.5 mg by nebulization every 6 (six) hours as needed for wheezing or shortness of breath.    [provider]  alendronate (FOSAMAX) 70 MG tablet TK 1 T PO 1 TIME A WK 09/06/16   [provider]  Ascorbic Acid (VITAMIN C) 1000 MG tablet Take 1,000 mg by mouth 2 (two) times daily.    [provider]  aspirin  EC 81 MG tablet Take 81 mg by mouth daily.    [provider]  Azelaic Acid (FINACEA) 15 % cream Apply topically 2 (two) times daily. After skin is thoroughly washed and patted dry, gently but thoroughly massage a thin film of azelaic acid cream into the affected area twice daily, in the morning and evening.    [provider]  betamethasone dipropionate (DIPROLENE) 0.05 % cream Apply topically 2 (two) times daily.    [provider]  BREO ELLIPTA  100-25 MCG/INH AEPB INL 1 PUFF PO QD 09/06/16   [provider]  cholecalciferol  (VITAMIN D ) 1000 units tablet Take 1,000 Units by mouth 2 (two) times daily.    [provider]  esomeprazole (NEXIUM) 20 MG capsule TK 1 C PO D 09/06/16   [provider]  estradiol (ESTRACE) 0.1 MG/GM vaginal cream Place 1 Applicatorful vaginally at bedtime.    [provider]  ferrous sulfate 325 (65 FE) MG tablet Take 325 mg by  mouth 2 (two) times daily.    [provider]  fexofenadine (ALLEGRA) 180 MG tablet Take 180 mg by mouth daily.    [provider]  lisinopril  (PRINIVIL ,ZESTRIL ) 10 MG tablet Take 10 mg by mouth daily.    [provider]  mometasone (NASONEX) 50 MCG/ACT nasal spray SHAKE LQ AND U 2 SPRAYS IEN QD 09/17/16   [provider]  montelukast  (SINGULAIR ) 10 MG tablet Take 10 mg by mouth at bedtime.    [provider]  OVER THE COUNTER MEDICATION Fish Oil 350 mg. One time daily.    [provider]  OVER THE COUNTER MEDICATION Calcium  Citrate/ vitamin D3/ and Magnesium 4000 mg. Taken 2 times daily.    [provider]  OVER THE COUNTER MEDICATION Citrucel 2 g daily.    [provider]  OVER THE COUNTER MEDICATION Phillips Colon Health Probiotic Capsule daily.    [provider]  vitamin B-12 (CYANOCOBALAMIN ) 500 MCG tablet Take 500 mcg by mouth daily.    [provider]  ZADITOR 0.025 % ophthalmic solution INSTILL 1 DROP INTO AFFECTED EYE BID PRN 06/19/16   [provider]    Physical Exam: BP (!) 173/84   Pulse 85   Temp 98 F (36.7 C) (Oral)   Resp 12   SpO2 97%  General: Pleasant, well-appearing *** laying in bed. No acute distress. HEENT: [place]/AT. Anicteric sclera CV: RRR. No murmurs, rubs, or gallops. No LE edema Pulmonary: Lungs CTAB. Normal effort. No wheezing or rales. Abdominal: Soft, nontender, nondistended. Normal bowel sounds. Extremities: Palpable radial and DP pulses. Normal ROM. Skin: Warm and dry. No obvious rash or lesions. Neuro: A&Ox3. Moves all extremities. Normal sensation to light touch. No focal deficit. Psych: Normal mood and affect          Labs on Admission:  Basic Metabolic Panel: Recent Labs  Lab 10/01/23 1508  NA 129*  K 4.0  CL 94*  CO2 24  GLUCOSE 111*  BUN 9  CREATININE 0.38*  CALCIUM  9.3   Liver Function Tests: Recent Labs  Lab 10/01/23 1508  AST 20  ALT 16  ALKPHOS 68  BILITOT 0.5  PROT 7.3  ALBUMIN 4.2   No results for input(s): LIPASE, AMYLASE in the last 168 hours. No results for input(s): AMMONIA in the last 168 hours. CBC: Recent Labs  Lab 10/01/23 1508  WBC 8.3  NEUTROABS 5.6  HGB 12.5  HCT 38.1  MCV 88.8  PLT 316   Cardiac Enzymes: No results for input(s): CKTOTAL, CKMB, CKMBINDEX, TROPONINI in the last 168 hours. BNP (last 3 results) No results for input(s): BNP in the last 8760 hours.  ProBNP (last 3 results) No results for input(s): PROBNP in the last 8760 hours.  CBG: Recent Labs  Lab 10/01/23 1509  GLUCAP 109*    Radiological Exams on  Admission: CT ANGIO HEAD NECK W WO CM Result Date: 10/01/2023 EXAM: CTA Head and Neck with Intravenous Contrast. CT Head without Contrast. CLINICAL HISTORY: Transient ischemic attack (TIA); confusion, expressive aphasia. TECHNIQUE: Axial CTA images of the head and neck performed with intravenous contrast. MIP reconstructed images were created and reviewed. Axial computed tomography images of the head/brain performed without intravenous contrast. Note: Per PQRS, the description of internal carotid artery percent stenosis, including 0 percent or normal exam, is based on [place] American Symptomatic Carotid Endarterectomy Trial (NASCET) criteria. Dose reduction technique was used including one or more of the following: automated exposure control, adjustment of mA  and kV according to patient size, and/or iterative reconstruction. CONTRAST: With; 75 mL iohexol  (OMNIPAQUE ) 350 mg/ml injection. COMPARISON: CT Head dated 11/14/2005. FINDINGS: CT HEAD: BRAIN: No acute intraparenchymal hemorrhage. No mass lesion. No CT evidence for acute territorial infarct. No midline shift or extra-axial collection. VENTRICLES: No hydrocephalus. ORBITS: The orbits are unremarkable. SINUSES AND MASTOIDS: The paranasal sinuses and mastoid air cells are clear. CTA NECK: COMMON CAROTID ARTERIES: No significant stenosis. No dissection or occlusion. INTERNAL CAROTID ARTERIES: Moderate circumferential calcific plaque within the origin of the right internal carotid artery with less than 20% luminal stenosis. Mild-to-moderate calcific plaque within the origin of the left internal carotid artery, also with less than 20% luminal stenosis. An ulcerative plaque arising medially from the superior cervical segment of the left internal carotid artery, seen on image 150 of series 16, measuring approximately 3 x 2.5 mm. VERTEBRAL ARTERIES: No significant stenosis. No dissection or occlusion. CTA HEAD: ANTERIOR CEREBRAL ARTERIES: No significant stenosis. No  occlusion. No aneurysm. MIDDLE CEREBRAL ARTERIES: No significant stenosis. No occlusion. No aneurysm. POSTERIOR CEREBRAL ARTERIES: No significant stenosis. No occlusion. No aneurysm. BASILAR ARTERY: No significant stenosis. No occlusion. No aneurysm. OTHER: Mild calcific atheromatous disease within the aortic arch. SOFT TISSUES: No acute finding. No masses or lymphadenopathy. BONES: No acute osseous abnormality. IMPRESSION: 1. 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 Referral to a neurointerventionalist or vascular surgeon is recommended. 2. No acute intracranial hemorrhage or ischemic change. 3. Moderate circumferential calcific plaque within the origin of the right internal carotid artery with less than 20% luminal stenosis. 4. Mild-to-moderate calcific plaque within the origin of the left internal carotid artery with less than 20% luminal stenosis. Electronically signed by: evalene coho 10/01/2023 06:26 PM EDT RP Workstation: [id number]   Assessment/Plan Cassandra Spears is a 73 y.o. female with medical history significant for asthma, HTN, osteoporosis, allergies, GERD, and arthritis who presented to the ED for evaluation of altered mental status and aphasia.   # TIA   # HTN - BP elevated with SBP in the 160s to 190s - Permissive hypertension as above - Resume lisinopril  tomorrow  # HLD - Continue rosuvastatin  - Follow-up repeat lipid panel  # Arthritis - Continue scheduled NSAID and as needed tramadol  # Asthma - Chronic and stable - Continue Breo Ellipta   # Allergies # Allergic rhinitis - Continue Singulair , loratadine  and Flonase   # GERD - Continue PPI   DVT prophylaxis: Lovenox      Code Status: Full Code  Consults called: Vascular surgery, neurology  Family Communication: Discussed admission with spouse at bedside  Severity of Illness: The appropriate patient status  for this patient is OBSERVATION. Observation status is judged to be reasonable and necessary in order to provide the required intensity of service to ensure the patient's safety. The patient's presenting symptoms, physical exam findings, and initial radiographic and laboratory data in the context of their medical condition is felt to place them at decreased risk for further clinical deterioration. Furthermore, it is anticipated that the patient will be medically stable for discharge from the hospital within 2 midnights of admission.   Level of care: Progressive   This record has been created using Conservation officer, historic buildings. Errors have been sought and corrected, but may not always be located. Such creation errors do not reflect on the standard of care.   Lou Claretta HERO, MD 10/01/2023, 11:26 PM [facility] Pager: [phone] [place]  41:10   If 7PM-7AM, please contact night-coverage [email or web address] Password [email or web address]

## 2023-10-01 NOTE — ED Triage Notes (Signed)
 Pt reports he woke up around 11p last night to find pt trying to get back dressed from bathroom saying 'I don't know what I'm supposed to do, I don't know where to get off'. Went to bathroom multiples times overnight which is normal, with confusion continuing. Around 8am was more coherent but having difficulty with complex words. Was more oriented mid morning. Still having difficulty finding some words. Speaking coherently in triage, able to discuss medications she is taking and when. A&Ox4

## 2023-10-02 ENCOUNTER — Other Ambulatory Visit (HOSPITAL_COMMUNITY): Payer: Self-pay

## 2023-10-02 ENCOUNTER — Observation Stay (HOSPITAL_COMMUNITY)

## 2023-10-02 DIAGNOSIS — G459 Transient cerebral ischemic attack, unspecified: Secondary | ICD-10-CM | POA: Diagnosis not present

## 2023-10-02 DIAGNOSIS — R569 Unspecified convulsions: Secondary | ICD-10-CM

## 2023-10-02 DIAGNOSIS — I10 Essential (primary) hypertension: Secondary | ICD-10-CM | POA: Insufficient documentation

## 2023-10-02 DIAGNOSIS — K219 Gastro-esophageal reflux disease without esophagitis: Secondary | ICD-10-CM | POA: Diagnosis not present

## 2023-10-02 DIAGNOSIS — J45909 Unspecified asthma, uncomplicated: Secondary | ICD-10-CM | POA: Diagnosis not present

## 2023-10-02 DIAGNOSIS — M069 Rheumatoid arthritis, unspecified: Secondary | ICD-10-CM | POA: Diagnosis not present

## 2023-10-02 DIAGNOSIS — Z7982 Long term (current) use of aspirin: Secondary | ICD-10-CM | POA: Diagnosis not present

## 2023-10-02 DIAGNOSIS — R0683 Snoring: Secondary | ICD-10-CM | POA: Diagnosis not present

## 2023-10-02 DIAGNOSIS — G4730 Sleep apnea, unspecified: Secondary | ICD-10-CM | POA: Diagnosis not present

## 2023-10-02 DIAGNOSIS — R4701 Aphasia: Secondary | ICD-10-CM | POA: Insufficient documentation

## 2023-10-02 DIAGNOSIS — R4182 Altered mental status, unspecified: Secondary | ICD-10-CM | POA: Diagnosis present

## 2023-10-02 DIAGNOSIS — Z79899 Other long term (current) drug therapy: Secondary | ICD-10-CM | POA: Diagnosis not present

## 2023-10-02 DIAGNOSIS — E871 Hypo-osmolality and hyponatremia: Secondary | ICD-10-CM

## 2023-10-02 DIAGNOSIS — I6523 Occlusion and stenosis of bilateral carotid arteries: Secondary | ICD-10-CM | POA: Diagnosis not present

## 2023-10-02 DIAGNOSIS — M25559 Pain in unspecified hip: Secondary | ICD-10-CM | POA: Diagnosis not present

## 2023-10-02 DIAGNOSIS — Q048 Other specified congenital malformations of brain: Secondary | ICD-10-CM | POA: Diagnosis not present

## 2023-10-02 DIAGNOSIS — E785 Hyperlipidemia, unspecified: Secondary | ICD-10-CM | POA: Diagnosis not present

## 2023-10-02 LAB — SODIUM, URINE, RANDOM
Sodium, Ur: 78 mmol/L
Sodium, Ur: 50 mmol/L

## 2023-10-02 LAB — BASIC METABOLIC PANEL WITH GFR
Anion gap: 8 (ref 5–15)
BUN: 6 mg/dL — ABNORMAL LOW (ref 8–23)
CO2: 26 mmol/L (ref 22–32)
Calcium: 9 mg/dL (ref 8.9–10.3)
Chloride: 96 mmol/L — ABNORMAL LOW (ref 98–111)
Creatinine, Ser: 0.51 mg/dL (ref 0.44–1.00)
GFR, Estimated: >60 mL/min (ref >60)
Glucose, Bld: 108 mg/dL — ABNORMAL HIGH (ref 70–99)
Potassium: 3.5 mmol/L (ref 3.5–5.1)
Sodium: 130 mmol/L — ABNORMAL LOW (ref 135–145)

## 2023-10-02 LAB — LIPID PANEL
Cholesterol: 166 mg/dL (ref 0–200)
HDL: 81 mg/dL (ref >40)
LDL Cholesterol: 74 mg/dL (ref 0–99)
Total CHOL/HDL Ratio: 2 ratio
Triglycerides: 53 mg/dL (ref <150)
VLDL: 11 mg/dL (ref 0–40)

## 2023-10-02 LAB — ECHOCARDIOGRAM COMPLETE
Area-P 1/2: 3.54 cm2
S' Lateral: 2.6 cm

## 2023-10-02 LAB — CBC
HCT: 33.5 % — ABNORMAL LOW (ref 36.0–46.0)
Hemoglobin: 11.4 g/dL — ABNORMAL LOW (ref 12.0–15.0)
MCH: 29.5 pg (ref 26.0–34.0)
MCHC: 34 g/dL (ref 30.0–36.0)
MCV: 86.8 fL (ref 80.0–100.0)
Platelets: 291 K/uL (ref 150–400)
RBC: 3.86 MIL/uL — ABNORMAL LOW (ref 3.87–5.11)
RDW: 13.1 % (ref 11.5–15.5)
WBC: 6.6 K/uL (ref 4.0–10.5)
nRBC: 0 % (ref 0.0–0.2)

## 2023-10-02 LAB — OSMOLALITY, URINE
Osmolality, Ur: 385 mosm/kg (ref 300–900)
Osmolality, Ur: 339 mosm/kg (ref 300–900)

## 2023-10-02 LAB — RAPID URINE DRUG SCREEN, HOSP PERFORMED
Amphetamines: NOT DETECTED
Barbiturates: NOT DETECTED
Benzodiazepines: NOT DETECTED
Cocaine: NOT DETECTED
Opiates: NOT DETECTED
Tetrahydrocannabinol: NOT DETECTED

## 2023-10-02 MED ORDER — LEVETIRACETAM 500 MG PO TABS
500.0000 mg | ORAL_TABLET | Freq: Two times a day (BID) | ORAL | 0 refills | Status: DC
  Filled 2023-10-02: qty 180, 90d supply, fill #0

## 2023-10-02 MED ORDER — LEVETIRACETAM 250 MG PO TABS
500.0000 mg | ORAL_TABLET | Freq: Two times a day (BID) | ORAL | Status: DC
  Administered 2023-10-02: 500 mg via ORAL
  Filled 2023-10-02 (×2): qty 2

## 2023-10-02 MED ORDER — ASPIRIN 81 MG PO TBEC
81.0000 mg | DELAYED_RELEASE_TABLET | Freq: Every day | ORAL | Status: DC
  Administered 2023-10-02: 81 mg via ORAL
  Filled 2023-10-02: qty 1

## 2023-10-02 NOTE — ED Notes (Signed)
 [facility] called to set up transport

## 2023-10-02 NOTE — Discharge Summary (Signed)
 Physician Discharge Summary  Cassandra Spears FMW:[id number] DOB: [birth year] DOA: 10/01/2023  PCP: Stephanie Charlene CROME, MD  Admit date: 10/01/2023 Discharge date: 10/02/2023  Admitted From: Home Disposition:  Home   Recommendations for Outpatient Follow-up:  Follow up with PCP in 1 week Follow up with neurology in 8 to 12 weeks  Discharge Condition: Stable CODE STATUS: Full code Diet recommendation: Regular diet  Brief/Interim Summary: Cassandra Spears is a 73 y.o. female with medical history significant for asthma, HTN, osteoporosis, allergies, GERD, and arthritis who presented to the ED for evaluation of altered mental status and aphasia.  Patient reports around 11 PM last night, she had trouble getting out of bed.  Spouse reports he held patient to the restroom while in the restroom, she kept repeating 'I do not know what I am supposed to do. I do not know where to get off.  He gave patient a wash rag and she did not know what to do with it. Patient was incoherent all night and could not tell him her name, DOB of current year. Patient was more oriented this morning however she continued to have difficulty with comprehension and word finding. Patient reports increased urinary frequency at night over the last few weeks and left hip pain currently pending surgery but denies any dysuria, weakness, numbness, tingling, chest pain, vision changes, headache or dizziness. Spouse did not notice any facial droop or slurred speech. Patient reports she is close to her baseline but still has intermittent difficulty with word finding.   ED Course: Initial vitals show afebrile but hypertensive with SBP in the 160s to 190s. Initial labs significant for sodium 129, glucose 111, normal kidney function, normal CBC, normal PT/INR and UA with no signs of infection. EKG shows sinus rhythm with RAE.  CTA head and neck shows a 3.0 x 2.5 mm ulcerative plaque in the left internal carotid artery but no acute intracranial  abnormalities.  Pt received aspirin  325 mg x 1, Plavix  300 mg x 1 and IV fentanyl  25 mcg x 1. Vascular surgery and neurology was consulted for evaluation. TRH was consulted for admission.    MRI brain negative for acute abnormality. Patient underwent EEG which revealed intermittent slowing of the left temporal region.  Case discussed with neurology, they felt that patient's episode was possibly a seizure emanating from the left brain and the EEG is likely showing postictal slowing.  Patient was started on Keppra  500 mg twice daily with close outpatient neurology.  Discharge Diagnoses:   Principal Problem:   Seizure (HCC) Active Problems:   Hyponatremia   HTN (hypertension)   Aphasia   Discharge Instructions  Discharge Instructions     Ambulatory referral to Neurology   Complete by: As directed    An appointment is requested in approximately: 8 weeks Seizure and sleep study   Call MD for:  difficulty breathing, headache or visual disturbances   Complete by: As directed    Call MD for:  extreme fatigue   Complete by: As directed    Call MD for:  persistant dizziness or light-headedness   Complete by: As directed    Call MD for:  persistant nausea and vomiting   Complete by: As directed    Call MD for:  severe uncontrolled pain   Complete by: As directed    Call MD for:  temperature >100.4   Complete by: As directed    Diet general   Complete by: As directed    Discharge instructions  Complete by: As directed    You were cared for by a hospitalist during your hospital stay. If you have any questions about your discharge medications or the care you received while you were in the hospital after you are discharged, you can call the unit and ask to speak with the hospitalist on call if the hospitalist that took care of you is not available. Once you are discharged, your primary care physician will handle any further medical issues. Please note that NO REFILLS for any discharge  medications will be authorized once you are discharged, as it is imperative that you return to your primary care physician (or establish a relationship with a primary care physician if you do not have one) for your aftercare needs so that they can reassess your need for medications and monitor your lab values.  Per [place]  DM[place] statutes, patients with seizures are not allowed to drive until they have been seizure-free for six months. Use caution when using heavy equipment or power tools. Avoid working on ladders or at heights. Take showers instead of baths. Ensure the water temperature is not too high on the home water heater. Do not go swimming alone. When caring for infants or small children, sit down when holding, feeding, or changing them to minimize risk of injury to the child in the event you have a seizure. Also, Maintain good sleep hygiene. Avoid alcohol.  If patient has another seizure, call 911 and bring them back to the ED if: A.  The seizure lasts longer than 5 minutes.      B.  The patient doesn't wake shortly after the seizure or has new problems such as difficulty seeing, speaking or moving following the seizure C.  The patient was injured during the seizure D.  The patient has a temperature over 102 F (39C) E.  The patient vomited during the seizure and now is having trouble breathing     Increase activity slowly   Complete by: As directed       Allergies as of 10/02/2023   No Known Allergies      Medication List     TAKE these medications    acetaminophen  500 MG tablet Commonly known as: TYLENOL  Take 1,000 mg by mouth 3 (three) times daily as needed for mild pain (pain score 1-3).   albuterol (2.5 MG/3ML) 0.083% nebulizer solution Commonly known as: PROVENTIL Take 2.5 mg by nebulization every 6 (six) hours as needed for wheezing or shortness of breath.   albuterol 108 (90 Base) MCG/ACT inhaler Commonly known as: VENTOLIN HFA Inhale 1-2 puffs into the lungs  every 6 (six) hours as needed for wheezing or shortness of breath.   betamethasone dipropionate 0.05 % cream Apply topically as needed.   Breo Ellipta  100-25 MCG/INH Aepb Generic drug: fluticasone  furoate-vilanterol INL 1 PUFF PO QD   cholecalciferol  1000 units tablet Commonly known as: VITAMIN D  Take 2,000 Units by mouth daily.   COLACE PO Take 1 tablet by mouth daily as needed (Constipation).   CULTURELLE PROBIOTICS PO Take 1 tablet by mouth daily.   diclofenac 75 MG EC tablet Commonly known as: VOLTAREN Take 75 mg by mouth 2 (two) times daily.   esomeprazole 20 MG capsule Commonly known as: NEXIUM TK 1 C PO D   estradiol 0.1 MG/GM vaginal cream Commonly known as: ESTRACE Place 1 Applicatorful vaginally at bedtime.   fexofenadine 180 MG tablet Commonly known as: ALLEGRA Take 180 mg by mouth daily.   Finacea 15 %  gel Generic drug: Azelaic Acid Apply topically 2 (two) times daily. After skin is thoroughly washed and patted dry, gently but thoroughly massage a thin film of azelaic acid cream into the affected area twice daily, in the morning and evening.   levETIRAcetam  500 MG tablet Commonly known as: KEPPRA  Take 1 tablet (500 mg total) by mouth 2 (two) times daily.   lisinopril  40 MG tablet Commonly known as: ZESTRIL  Take 40 mg by mouth daily.   mometasone 50 MCG/ACT nasal spray Commonly known as: NASONEX Place 1 spray into the nose daily.   montelukast  10 MG tablet Commonly known as: SINGULAIR  Take 10 mg by mouth at bedtime.   MULTIVITAMIN ADULTS 50+ PO Take 1 tablet by mouth every other day.   OVER THE COUNTER MEDICATION Take 1 tablet by mouth daily. Mega Red 500 Mg   OVER THE COUNTER MEDICATION Calcium  Citrate/ vitamin D3/ and Magnesium 4000 mg. Taken 2 times daily.   RECLAST  IV Inject into the vein See admin instructions. Given once a year for Osteoporosis   rosuvastatin  10 MG tablet Commonly known as: CRESTOR  Take 10 mg by mouth at bedtime.    traMADol  50 MG tablet Commonly known as: ULTRAM  Take 50 mg by mouth 3 (three) times daily as needed.   vitamin C 1000 MG tablet Take 1,000 mg by mouth 2 (two) times daily.   Voltaren 1 % Gel Generic drug: diclofenac Sodium Apply 2 g topically 4 (four) times daily.   Zaditor 0.025 % ophthalmic solution Generic drug: ketotifen Place 1 drop into both eyes 2 (two) times daily as needed (itchy eyes).        Follow-up Information     Hamrick, Charlene CROME, MD Follow up.   Specialty: Family Medicine Contact information: [address] [place] [address] [phone]         [facility]. Schedule an appointment as soon as possible for a visit in 8 week(s).   Why: Outpatient referral sent. Contact information: [address]     [address] [place]  [address] [phone]               No Known Allergies  Consultations: Neurology    Procedures/Studies: EEG adult Result Date: 10/02/2023 Shelton Arlin KIDD, MD     10/02/2023 11:46 AM Patient Name: Cassandra Spears MRN: [id number] Epilepsy Attending: Arlin KIDD Shelton Referring Physician/Provider: Rojelio Nest, DO Date: 10/02/2023 Duration: 25.24 mins Patient history: 73 y.o. female with a transient confusional episode. She had significant perseveration with repetition of the same words over and over again. She is amnestic to most of the episode. EEG to evaluate for seizure. Level of alertness: Awake, asleep AEDs during EEG study: None Technical aspects: This EEG study was done with scalp electrodes positioned according to the 10-20 International system of electrode placement. Electrical activity was reviewed with band pass filter of 1-70Hz , sensitivity of 7 uV/mm, display speed of 62mm/sec with a 60Hz  notched filter applied as appropriate. EEG data were recorded continuously and digitally stored.  Video monitoring was available and reviewed as appropriate. Description: The posterior dominant  rhythm consists of 9-10 Hz activity of moderate voltage (25-35 uV) seen predominantly in posterior head regions, symmetric and reactive to eye opening and eye closing. Sleep was characterized by vertex waves, sleep spindles (12 to 14 Hz), maximal frontocentral region. EEG showed intermittent 3 to 5 Hz theta-delta slowing in left temporal region. Hyperventilation and photic stimulation were not performed.   ABNORMALITY - Intermittent slow,  left temporal IMPRESSION: This study is suggestive of cortical dysfunction arising from left temporal region, nonspecific etiology. No seizures or epileptiform discharges were seen throughout the recording. Arlin MALVA Krebs   MR BRAIN WO CONTRAST Result Date: 10/02/2023 EXAM: MRI BRAIN WITHOUT CONTRAST 10/02/2023 05:57:35 AM TECHNIQUE: Multiplanar multisequence MRI of the head/brain was performed without the administration of intravenous contrast. COMPARISON: None available. CLINICAL HISTORY: Transient ischemic attack (TIA). 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 FINDINGS: BRAIN AND VENTRICLES: No acute infarct. No intracranial hemorrhage. No mass. No midline shift. No hydrocephalus. The sella is unremarkable. Normal flow voids. Periventricular and scattered subcortical T2 hyperintensities are mildly advanced for age. Asymmetric T2 signal was present in the anterior right frontal lobe surrounding a developmental venous anomaly. ORBITS: No acute abnormality. SINUSES AND MASTOIDS: No acute abnormality. BONES AND SOFT TISSUES: Normal marrow signal. No acute soft tissue abnormality. IMPRESSION: 1. No acute intracranial abnormality. 2. Mildly advanced periventricular and scattered subcortical T2 hyperintensities for age. This most likely reflects the sequelae of chronic microvascular ischemia. Electronically signed by: Lonni Necessary MD 10/02/2023 06:19 AM EDT RP Workstation: [id number]   CT ANGIO  HEAD NECK W WO CM Result Date: 10/01/2023 EXAM: CTA Head and Neck with Intravenous Contrast. CT Head without Contrast. CLINICAL HISTORY: Transient ischemic attack (TIA); confusion, expressive aphasia. TECHNIQUE: Axial CTA images of the head and neck performed with intravenous contrast. MIP reconstructed images were created and reviewed. Axial computed tomography images of the head/brain performed without intravenous contrast. Note: Per PQRS, the description of internal carotid artery percent stenosis, including 0 percent or normal exam, is based on [place] American Symptomatic Carotid Endarterectomy Trial (NASCET) criteria. Dose reduction technique was used including one or more of the following: automated exposure control, adjustment of mA and kV according to patient size, and/or iterative reconstruction. CONTRAST: With; 75 mL iohexol  (OMNIPAQUE ) 350 mg/ml injection. COMPARISON: CT Head dated 11/14/2005. FINDINGS: CT HEAD: BRAIN: No acute intraparenchymal hemorrhage. No mass lesion. No CT evidence for acute territorial infarct. No midline shift or extra-axial collection. VENTRICLES: No hydrocephalus. ORBITS: The orbits are unremarkable. SINUSES AND MASTOIDS: The paranasal sinuses and mastoid air cells are clear. CTA NECK: COMMON CAROTID ARTERIES: No significant stenosis. No dissection or occlusion. INTERNAL CAROTID ARTERIES: Moderate circumferential calcific plaque within the origin of the right internal carotid artery with less than 20% luminal stenosis. Mild-to-moderate calcific plaque within the origin of the left internal carotid artery, also with less than 20% luminal stenosis. An ulcerative plaque arising medially from the superior cervical segment of the left internal carotid artery, seen on image 150 of series 16, measuring approximately 3 x 2.5 mm. VERTEBRAL ARTERIES: No significant stenosis. No dissection or occlusion. CTA HEAD: ANTERIOR CEREBRAL ARTERIES: No significant stenosis. No occlusion. No aneurysm.  MIDDLE CEREBRAL ARTERIES: No significant stenosis. No occlusion. No aneurysm. POSTERIOR CEREBRAL ARTERIES: No significant stenosis. No occlusion. No aneurysm. BASILAR ARTERY: No significant stenosis. No occlusion. No aneurysm. OTHER: Mild calcific atheromatous disease within the aortic arch. SOFT TISSUES: No acute finding. No masses or lymphadenopathy. BONES: No acute osseous abnormality. IMPRESSION: 1. 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 Referral to a neurointerventionalist or vascular surgeon is recommended. 2. No acute intracranial hemorrhage or ischemic change. 3. Moderate circumferential calcific plaque within the origin of the right internal carotid artery with less than 20% luminal stenosis. 4. Mild-to-moderate calcific plaque within the origin  of the left internal carotid artery with less than 20% luminal stenosis. Electronically signed by: evalene coho 10/01/2023 06:26 PM EDT RP Workstation: [id number]      Discharge Exam: Vitals:   10/02/23 0829 10/02/23 1246  BP:    Pulse:  87  Resp:    Temp: 98 F (36.7 C) (!) 97.4 F (36.3 C)  SpO2:      General: Pt is alert, awake, not in acute distress Cardiovascular: RRR, S1/S2 +, no edema Respiratory: CTA bilaterally, no wheezing, no rhonchi, no respiratory distress, no conversational dyspnea  Abdominal: Soft, NT, ND, bowel sounds + Extremities: no edema, no cyanosis Psych: Normal mood and affect, stable judgement and insight     The results of significant diagnostics from this hospitalization (including imaging, microbiology, ancillary and laboratory) are listed below for reference.     Microbiology: No results found for this or any previous visit (from the past 240 hours).   Labs: BNP (last 3 results) No results for input(s): BNP in the last 8760 hours. Basic Metabolic Panel: Recent Labs  Lab 10/01/23 1508  10/02/23 0606  NA 129* 130*  K 4.0 3.5  CL 94* 96*  CO2 24 26  GLUCOSE 111* 108*  BUN 9 6*  CREATININE 0.38* 0.51  CALCIUM  9.3 9.0   Liver Function Tests: Recent Labs  Lab 10/01/23 1508  AST 20  ALT 16  ALKPHOS 68  BILITOT 0.5  PROT 7.3  ALBUMIN 4.2   No results for input(s): LIPASE, AMYLASE in the last 168 hours. No results for input(s): AMMONIA in the last 168 hours. CBC: Recent Labs  Lab 10/01/23 1508 10/02/23 0606  WBC 8.3 6.6  NEUTROABS 5.6  --   HGB 12.5 11.4*  HCT 38.1 33.5*  MCV 88.8 86.8  PLT 316 291   Cardiac Enzymes: No results for input(s): CKTOTAL, CKMB, CKMBINDEX, TROPONINI in the last 168 hours. BNP: Invalid input(s): POCBNP CBG: Recent Labs  Lab 10/01/23 1509  GLUCAP 109*   D-Dimer No results for input(s): DDIMER in the last 72 hours. Hgb A1c No results for input(s): HGBA1C in the last 72 hours. Lipid Profile Recent Labs    10/02/23 0606  CHOL 166  HDL 81  LDLCALC 74  TRIG 53  CHOLHDL 2.0   Thyroid function studies No results for input(s): TSH, T4TOTAL, T3FREE, THYROIDAB in the last 72 hours.  Invalid input(s): FREET3 Anemia work up No results for input(s): VITAMINB12, FOLATE, FERRITIN, TIBC, IRON, RETICCTPCT in the last 72 hours. Urinalysis    Component Value Date/Time   COLORURINE YELLOW 10/01/2023 1522   APPEARANCEUR CLEAR 10/01/2023 1522   LABSPEC 1.011 10/01/2023 1522   PHURINE 7.0 10/01/2023 1522   GLUCOSEU NEGATIVE 10/01/2023 1522   HGBUR NEGATIVE 10/01/2023 1522   BILIRUBINUR NEGATIVE 10/01/2023 1522   KETONESUR 5 (A) 10/01/2023 1522   PROTEINUR NEGATIVE 10/01/2023 1522   NITRITE NEGATIVE 10/01/2023 1522   LEUKOCYTESUR NEGATIVE 10/01/2023 1522   Sepsis Labs Recent Labs  Lab 10/01/23 1508 10/02/23 0606  WBC 8.3 6.6   Microbiology No results found for this or any previous visit (from the past 240 hours).   Patient was seen and examined on the day of discharge and  was found to be in stable condition. Time coordinating discharge: 40 minutes including assessment and coordination of care, as well as examination of the patient.   SIGNED:  Delon Hoe, DO [facility] 10/02/2023, 2:15 PM

## 2023-10-02 NOTE — Procedures (Signed)
 Patient Name: Cassandra Spears  MRN: [id number]  Epilepsy Attending: Arlin MALVA Krebs  Referring Physician/Provider: Rojelio Nest, DO  Date: 10/02/2023 Duration: 25.24 mins  Patient history: 73 y.o. female with a transient confusional episode. She had significant perseveration with repetition of the same words over and over again. She is amnestic to most of the episode. EEG to evaluate for seizure.  Level of alertness: Awake, asleep  AEDs during EEG study: None  Technical aspects: This EEG study was done with scalp electrodes positioned according to the 10-20 International system of electrode placement. Electrical activity was reviewed with band pass filter of 1-70Hz , sensitivity of 7 uV/mm, display speed of 36mm/sec with a 60Hz  notched filter applied as appropriate. EEG data were recorded continuously and digitally stored.  Video monitoring was available and reviewed as appropriate.  Description: The posterior dominant rhythm consists of 9-10 Hz activity of moderate voltage (25-35 uV) seen predominantly in posterior head regions, symmetric and reactive to eye opening and eye closing. Sleep was characterized by vertex waves, sleep spindles (12 to 14 Hz), maximal frontocentral region. EEG showed intermittent 3 to 5 Hz theta-delta slowing in left temporal region. Hyperventilation and photic stimulation were not performed.     ABNORMALITY - Intermittent slow, left temporal  IMPRESSION: This study is suggestive of cortical dysfunction arising from left temporal region, nonspecific etiology. No seizures or epileptiform discharges were seen throughout the recording.  March Joos O Boni Maclellan

## 2023-10-02 NOTE — Evaluation (Signed)
 Physical Therapy Evaluation Patient Details Name: Cassandra Spears MRN: [id number] DOB: 03/25/1950 Today's Date: 10/02/2023  History of Present Illness  Pt is a 73 y.o. F who presents 10/01/2023 with transient confusional episode and significant perseveration with repetition of same words. MRI negative for acute abnormality. Significant PMH: HTN, arthritis, osteoporosis.  Clinical Impression  Pt admitted with above. PTA, pt lives with her spouse and is modI with ambulation using RW and with ADL's. Pt overall appears fairly close to her baseline. She is pending receiving a L THA at the end of the month and is already set up with follow up PT on 9/2. On PT evaluation, pt displays proximal LLE weakness and resulting gait abnormalities. Pt modI with transfers and ambulating 120 ft with a walker and supervision. Reviewed BEFAST stroke signs and symptoms. Will continue to follow acutely, but don't recommend any follow up PT currently.         If plan is discharge home, recommend the following: Assistance with cooking/housework;Assist for transportation;Help with stairs or ramp for entrance   Can travel by private vehicle        Equipment Recommendations Rolling walker (2 wheels)  Recommendations for Other Services       Functional Status Assessment Patient has had a recent decline in their functional status and demonstrates the ability to make significant improvements in function in a reasonable and predictable amount of time.     Precautions / Restrictions Precautions Precautions: Fall Recall of Precautions/Restrictions: Intact Restrictions Weight Bearing Restrictions Per Provider Order: No      Mobility  Bed Mobility Overal bed mobility: Modified Independent                  Transfers Overall transfer level: Modified independent Equipment used: Rolling walker (2 wheels)                    Ambulation/Gait Ambulation/Gait assistance: Supervision Gait Distance (Feet):  120 Feet Assistive device: Rolling walker (2 wheels) Gait Pattern/deviations: Step-to pattern, Step-through pattern, Decreased dorsiflexion - left, Decreased weight shift to left, Decreased stance time - left Gait velocity: decreased Gait velocity interpretation: <1.8 ft/sec, indicate of risk for recurrent falls   General Gait Details: Verbal cues for shorter L step length. Mod reliance through arms on walker  Stairs            Wheelchair Mobility     Tilt Bed    Modified Rankin (Stroke Patients Only) Modified Rankin (Stroke Patients Only) Pre-Morbid Rankin Score: Slight disability Modified Rankin: Slight disability     Balance Overall balance assessment: Needs assistance Sitting-balance support: Feet supported Sitting balance-Leahy Scale: Good     Standing balance support: During functional activity, No upper extremity supported Standing balance-Leahy Scale: Fair                               Pertinent Vitals/Pain Pain Assessment Pain Assessment: Faces Faces Pain Scale: Hurts a little bit Pain Location: L hip, R wrist Pain Descriptors / Indicators: Discomfort, Guarding, Sore Pain Intervention(s): Limited activity within patient's tolerance, Monitored during session    Home Living Family/patient expects to be discharged to:: Private residence Living Arrangements: Spouse/significant other Available Help at Discharge: Family Type of Home: House Home Access: Level entry       Home Layout: Other (Comment) (2 steps into family room, uses cane on middle step, RW on top step) Home Equipment: Cane - single point;Shower  seat;Standard Walker Additional Comments: Former Museum/gallery conservator    Prior Function Prior Level of Function : Needs assist             Mobility Comments: Had been using SPC up until 3 weeks ago when she transitioned to RW ADLs Comments: using shower bench, independent ADL's - uses folding stool to  help with LB dressing     Extremity/Trunk Assessment   Upper Extremity Assessment Upper Extremity Assessment: Defer to OT evaluation    Lower Extremity Assessment Lower Extremity Assessment: RLE deficits/detail;LLE deficits/detail RLE Deficits / Details: Grossly 5/5 except hip flexion 4+/5 LLE Deficits / Details: Hip flexion 3-/5, knee extension 5/5, ankle dorsiflexion 5/5       Communication   Communication Communication: No apparent difficulties    Cognition Arousal: Alert Behavior During Therapy: WFL for tasks assessed/performed   PT - Cognitive impairments: No apparent impairments                       PT - Cognition Comments: Pt pleasant and follows multi step commands - did pull call light in bathroom when attempting to flush toilet. OT to further assess higher level cog Following commands: Intact       Cueing Cueing Techniques: Verbal cues     General Comments      Exercises     Assessment/Plan    PT Assessment Patient needs continued PT services  PT Problem List Decreased strength;Decreased activity tolerance;Decreased balance;Decreased mobility;Pain       PT Treatment Interventions DME instruction;Gait training;Stair training;Functional mobility training;Therapeutic activities;Therapeutic exercise;Balance training;Patient/family education    PT Goals (Current goals can be found in the Care Plan section)  Acute Rehab PT Goals Patient Stated Goal: to receive THA and improve quality of life PT Goal Formulation: With patient Time For Goal Achievement: 10/16/23 Potential to Achieve Goals: Good    Frequency Min 1X/week     Co-evaluation               AM-PAC PT 6 Clicks Mobility  Outcome Measure Help needed turning from your back to your side while in a flat bed without using bedrails?: None Help needed moving from lying on your back to sitting on the side of a flat bed without using bedrails?: None Help needed moving to and from a  bed to a chair (including a wheelchair)?: None Help needed standing up from a chair using your arms (e.g., wheelchair or bedside chair)?: None Help needed to walk in hospital room?: A Little Help needed climbing 3-5 steps with a railing? : A Little 6 Click Score: 22    End of Session   Activity Tolerance: Patient tolerated treatment well Patient left: in bed;with call bell/phone within reach;with family/visitor present Nurse Communication: Mobility status PT Visit Diagnosis: Other abnormalities of gait and mobility (R26.89);Difficulty in walking, not elsewhere classified (R26.2);Pain Pain - Right/Left: Left Pain - part of body: Hip    Time: 0820-0851 PT Time Calculation (min) (ACUTE ONLY): 31 min   Charges:   PT Evaluation $PT Eval Low Complexity: 1 Low PT Treatments $Gait Training: 8-22 mins PT General Charges $$ ACUTE PT VISIT: 1 Visit         Aleck Daring, PT, DPT Acute Rehabilitation Services Office [phone]   Alayne ONEIDA Daring 10/02/2023, 9:05 AM

## 2023-10-02 NOTE — TOC Progression Note (Signed)
 Transition of Care [facility]) - Progression Note    Patient Details  Name: Cassandra Spears MRN: [id number] Date of Birth: April 19, 1950  [facility] CM/SW Contact  Nola Devere Hands, RN Phone Number: 10/02/2023, 3:48 PM  Clinical Narrative:    Case Manager requested a Rolling walker for patient. She has been using a standard walker. Patient to have THA at the end of the month. No further TOC needs at this time.    Expected Discharge Plan: Home/Self Care Barriers to Discharge: No Barriers Identified               Expected Discharge Plan and Services   Discharge Planning Services: CM Consult   Living arrangements for the past 2 months: Single Family Home Expected Discharge Date: 10/02/23               DME Arranged: Vannie rolling DME Agency: [employer] Date DME Agency Contacted: 10/02/23 Time DME Agency Contacted: 1530 Representative spoke with at DME Agency: [place] Budge HH Arranged: NA HH Agency: NA         Social Drivers of Health (SDOH) Interventions SDOH Screenings   Food Insecurity: No Food Insecurity (10/02/2023)  Housing: Low Risk  (10/02/2023)  Transportation Needs: No Transportation Needs (10/02/2023)  Utilities: Not At Risk (10/02/2023)  Social Connections: Unknown (10/02/2023)  Tobacco Use: Low Risk  (04/17/2022)   Received from [facility]    Readmission Risk Interventions     No data to display

## 2023-10-02 NOTE — Progress Notes (Addendum)
 NEUROLOGY CONSULT FOLLOW UP NOTE   Date of service: October 02, 2023 Patient Name: Cassandra Spears MRN:  [id number] DOB:  27-Jul-1950  Interval Hx/subjective   Spoke with patient and her husband at bedside. Cassandra Spears tells me that she does not remember the incident but is able to tell me that in the 2-3 days leading to it she has been having a dificult time sleeping due to severe L hip OA. She is currently averaging 3 hours of sleep per day with increase somnolence during the day.   Her husband reiterates the altered mentation event happened late at night, the patient seemed to be aware but like she was carrying a conversation in her head, asking or answering questions he did not understant. Patient returned to bed and has not had recurrence of this event since. Per husband and patient, she is now at her usual state.   Regarding her blood pressure, she measures it at home and SBP ~120-130s. It often goes up when she is in pain, which she thinks is what lead to the the BP peaks in the ED.   For pain she has been taking Tramadol  for 3 months now. No sleep aids or over the counter medications other than Tylenol .  Cassandra Spears also reports that Cassandra Spears snores loudly every night, has several pauses in her breathing during sleep, she is often faitgued and sleepy during the day.  Vitals   Vitals:   10/02/23 0150 10/02/23 0151 10/02/23 0454 10/02/23 0518  BP:  (!) 179/89 (!) 150/76   Pulse:  83  91  Resp:  18 18   Temp: 97.6 F (36.4 C)  98.4 F (36.9 C)   TempSrc: Oral  Oral   SpO2:  97%  97%     There is no height or weight on file to calculate BMI.  Physical Exam   Constitutional: Appears well-developed and well-nourished.  Psych: Affect appropriate to situation.  Eyes: No scleral injection.  HENT: No OP obstrucion.  Head: Normocephalic.  Cardiovascular: Normal rate and regular rhythm.  Respiratory: Effort normal, non-labored breathing.  GI: Soft.  No distension. There is no  tenderness.  Skin: WDI.   Neurologic Examination   Neurologic exam: Mental status: A&Ox3 Cranial Nerves:             II: PERRL             III, IV, VI: Extra-occular motions intact bilaterally             V, VII: Face symmetric, sensation intact in all 3 divisions               VIII: hearing normal to rubbing fingers bilaterally               IX, X: palate rises symmetrically             XI: Head turn and shoulder shrug normal bilaterally               XII: tongue midline    Motor: Strength 5/5 on all upper and lower extremities, bulk muscle and tone are normal Deep Tendon Reflexes: 2+ symmetric patellar. Down turning toes bilaterally  Gait: deferred. Sensory: Light touch intact and symmetric bilaterally  Coordination: There is no dysmetria on finger-to-nose Psychiatric: Normal mood and affect   Medications  Current Facility-Administered Medications:     stroke: early stages of recovery book, , Does not apply, Once, Lou Claretta HERO, MD   0.9 %  sodium chloride  infusion, , Intravenous, Continuous, Amponsah, Claretta HERO, MD, Last Rate: 75 mL/hr at 10/02/23 0233, Rate Change at 10/02/23 9766   acetaminophen  (TYLENOL ) tablet 650 mg, 650 mg, Oral, Q4H PRN, 650 mg at 10/01/23 2352 **OR** acetaminophen  (TYLENOL ) 160 MG/5ML solution 650 mg, 650 mg, Per Tube, Q4H PRN **OR** acetaminophen  (TYLENOL ) suppository 650 mg, 650 mg, Rectal, Q4H PRN, Lou, Claretta HERO, MD   aspirin  EC tablet 81 mg, 81 mg, Oral, Daily, Kirkpatrick, Aisha SQUIBB, MD   cholecalciferol  (VITAMIN D3) 25 MCG (1000 UNIT) tablet 2,000 Units, 2,000 Units, Oral, Daily, Amponsah, Claretta HERO, MD   enoxaparin  (LOVENOX ) injection 40 mg, 40 mg, Subcutaneous, Q24H, Lou, Claretta HERO, MD, 40 mg at 10/01/23 2320   fluticasone  (FLONASE ) 50 MCG/ACT nasal spray 1 spray, 1 spray, Each Nare, Daily, Amponsah, Claretta HERO, MD   fluticasone  furoate-vilanterol (BREO ELLIPTA ) 100-25 MCG/ACT 1 puff, 1 puff, Inhalation, Daily, Amponsah, Claretta HERO,  MD   ibuprofen  (ADVIL ) tablet 600 mg, 600 mg, Oral, TID, Amponsah, Claretta HERO, MD   lisinopril  (ZESTRIL ) tablet 40 mg, 40 mg, Oral, Daily, Amponsah, Claretta HERO, MD   loratadine  (CLARITIN ) tablet 10 mg, 10 mg, Oral, Daily, Amponsah, Claretta HERO, MD   montelukast  (SINGULAIR ) tablet 10 mg, 10 mg, Oral, QHS, Lou Claretta HERO, MD, 10 mg at 10/01/23 2347   multivitamin with minerals tablet 1 tablet, 1 tablet, Oral, QODAY, Amponsah, Claretta HERO, MD   pantoprazole  (PROTONIX ) EC tablet 40 mg, 40 mg, Oral, Daily, Amponsah, Claretta HERO, MD   rosuvastatin  (CRESTOR ) tablet 10 mg, 10 mg, Oral, QHS, Lou Claretta HERO, MD, 10 mg at 10/01/23 2347   senna-docusate (Senokot-S) tablet 1 tablet, 1 tablet, Oral, QHS PRN, Lou Claretta HERO, MD   traMADol  (ULTRAM ) tablet 50 mg, 50 mg, Oral, TID PRN, Lou Claretta HERO, MD, 50 mg at 10/02/23 0350  Labs and Diagnostic Imaging   CBC:  Recent Labs  Lab 10/01/23 1508 10/02/23 0606  WBC 8.3 6.6  NEUTROABS 5.6  --   HGB 12.5 11.4*  HCT 38.1 33.5*  MCV 88.8 86.8  PLT 316 291    Basic Metabolic Panel:  Lab Results  Component Value Date   NA 130 (L) 10/02/2023   K 3.5 10/02/2023   CO2 26 10/02/2023   GLUCOSE 108 (H) 10/02/2023   BUN 6 (L) 10/02/2023   CREATININE 0.51 10/02/2023   CALCIUM  9.0 10/02/2023   GFRNONAA >60 10/02/2023   GFRAA 133 12/01/2007   Lipid Panel:  Lab Results  Component Value Date   LDLCALC 74 10/02/2023   HgbA1c: No results found for: HGBA1C Urine Drug Screen: No results found for: LABOPIA, COCAINSCRNUR, LABBENZ, AMPHETMU, THCU, LABBARB  Alcohol Level No results found for: [facility] INR  Lab Results  Component Value Date   INR 0.9 10/01/2023   APTT  Lab Results  Component Value Date   APTT 26 10/01/2023   AED levels: No results found for: PHENYTOIN, ZONISAMIDE, LAMOTRIGINE, LEVETIRACETA  CT Head without contrast(Personally reviewed): Negative  CT angio Head and Neck with contrast(Personally  reviewed): Irregularity in the left carotid artery being read as ulcerated plaque  MRI Brain(Personally reviewed): 8/6 no acute intracranial abnormality.  Mildly advance periventricular and scattered subcortical T2 hyperintensities for age, likely related to chronic microvascular ischemia  rEEG:  Intermittent slowing in the left temporal lobe.  Assessment   Cassandra Spears is a 73 y.o. 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 She is now at her baseline mentation.  MRI brain unremarkable. EEG shows intermittent slowing in the left temporal lobe. Based on her clinical history, seizure was thought to be likely and the EEG slowing with normal exam is likely indicative of postictal slowing in the left temporal lobe. Attending had a detailed discussion with the patient regarding starting Keppra  but she was reluctant. Answered all her questions to the best of my ability.  Waiting for her to make a decision on whether to start Keppra  or follow-up with outpatient neurology without starting Keppra  and make a decision with the outpatient neurologist.  May also benefit from sleep study  On another note, had a question of questionable ulcerated plaque in the left carotid.  Discussed with interventionalists.  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  No intervention needed.  Recommendations  - I recommend Keppra  500 twice daily. - Follow up with [facility] in 8-12 weeks, ordered - Continue  ASA and statin - Normotensive goal - [facility] referral for sleep study for OSA and other sleep disorder - Trial of melatonin for sleep  - SEIZURE PRECAUTIONS Per [place]  DMV statutes, patients with seizures are not allowed to drive until they have been  seizure-free for six months.   Use caution when using heavy equipment or power tools. Avoid working on ladders or at heights. Take showers instead of baths. Ensure the water temperature is not too high on the home water heater. Do not go swimming alone. Do not lock yourself in a room alone (i.e. bathroom). When caring for infants or small children, sit down when holding, feeding, or changing them to minimize risk of injury to the child in the event you have a seizure. Maintain good sleep hygiene. Avoid alcohol.    If patient has another seizure, call 911 and bring them back to the ED if: A.  The seizure lasts longer than 5 minutes.      B.  The patient doesn't wake shortly after the seizure or has new problems such as difficulty seeing, speaking or moving following the seizure C.  The patient was injured during the seizure D.  The patient has a temperature over 102 F (39C) E.  The patient vomited during the seizure and now is having trouble breathing ______________________________________________________________________   Signed, Hadassah Ala, MD [facility] resident [facility]  Attending Neurohospitalist Addendum Patient seen and examined with APP/Resident. Agree with the history and physical as documented above. Agree with the plan as documented, which I helped formulate. I have independently reviewed the chart, obtained history, review of systems and examined the patient.I have personally reviewed pertinent head/neck/spine imaging (CT/MRI).  Plan discussed with Dr. Rojelio Please feel free to call with any questions.  -- Eligio Lav, MD Neurologist [facility] Pager: [phone]

## 2023-10-02 NOTE — Evaluation (Addendum)
 Occupational Therapy Evaluation and Discharge Patient Details Name: Cassandra Spears MRN: [id number] DOB: 05/18/1950 Today's Date: 10/02/2023   History of Present Illness   Pt is a 73 y.o. F who presents 10/01/2023 with transient confusional episode and significant perseveration with repetition of same words. MRI negative for acute abnormality. Significant PMH: HTN, arthritis, osteoporosis.     Clinical Impressions Pt has returned to her baseline modified independence in mobility, self care and cognition. Educate in importance of seeking medical attention quickly and s/s of CVA. No OT needs.      If plan is discharge home, recommend the following:   Assistance with cooking/housework;Help with stairs or ramp for entrance;Assist for transportation     Functional Status Assessment   Patient has had a recent decline in their functional status and demonstrates the ability to make significant improvements in function in a reasonable and predictable amount of time.     Equipment Recommendations   None recommended by OT     Recommendations for Other Services         Precautions/Restrictions   Precautions Precautions: Fall Recall of Precautions/Restrictions: Intact Restrictions Weight Bearing Restrictions Per Provider Order: No     Mobility Bed Mobility Overal bed mobility: Modified Independent                  Transfers Overall transfer level: Modified independent Equipment used: Rolling walker (2 wheels)                      Balance                                           ADL either performed or assessed with clinical judgement   ADL Overall ADL's : Modified independent                                             Vision Baseline Vision/History: 1 Wears glasses Ability to See in Adequate Light: 0 Adequate Patient Visual Report: No change from baseline       Perception         Praxis          Pertinent Vitals/Pain Pain Assessment Pain Assessment: Faces Faces Pain Scale: Hurts a little bit Pain Location: R upper arm, forearm Pain Descriptors / Indicators: Discomfort, Sore Pain Intervention(s): Monitored during session, Repositioned     Extremity/Trunk Assessment Upper Extremity Assessment Upper Extremity Assessment: Overall WFL for tasks assessed;Right hand dominant       Cervical / Trunk Assessment Cervical / Trunk Assessment: Normal   Communication Communication Communication: No apparent difficulties   Cognition Arousal: Alert Behavior During Therapy: WFL for tasks assessed/performed Cognition: No apparent impairments                               Following commands: Intact       Cueing  General Comments   Cueing Techniques: Verbal cues  provided gait belt to use a leg lifter   Exercises     Shoulder Instructions      Home Living Family/patient expects to be discharged to:: Private residence Living Arrangements: Spouse/significant other Available Help at Discharge: Family Type of Home: House Home Access: Level  entry     Home Layout: Other (Comment) (2 steps into living room)               Home Equipment: Cane - single point;Shower seat;Standard Environmental consultant          Prior Functioning/Environment Prior Level of Function : Needs assist             Mobility Comments: Had been using SPC up until 3 weeks ago when she transitioned to RW ADLs Comments: using shower bench, independent ADL's - uses folding stool to help with LB dressing, assistance for LE in bed    OT Problem List:     OT Treatment/Interventions:        OT Goals(Current goals can be found in the care plan section)       OT Frequency:       Co-evaluation              AM-PAC OT 6 Clicks Daily Activity     Outcome Measure Help from another person eating meals?: None Help from another person taking care of personal grooming?: None Help from  another person toileting, which includes using toliet, bedpan, or urinal?: None Help from another person bathing (including washing, rinsing, drying)?: None Help from another person to put on and taking off regular upper body clothing?: None Help from another person to put on and taking off regular lower body clothing?: None 6 Click Score: 24   End of Session    Activity Tolerance: Patient tolerated treatment well Patient left: in bed  OT Visit Diagnosis: Unsteadiness on feet (R26.81)                Time: 8869-8850 OT Time Calculation (min): 19 min Charges:  OT General Charges $OT Visit: 1 Visit OT Evaluation $OT Eval Low Complexity: 1 Low Mliss HERO, OTR/L Acute Rehabilitation Services Office: [phone]   Kennth Mliss Helling 10/02/2023, 12:41 PM

## 2023-10-02 NOTE — Care Management Obs Status (Signed)
 MEDICARE OBSERVATION STATUS NOTIFICATION   Patient Details  Name: Cassandra Spears MRN: [id number] Date of Birth: 1950/11/27   Medicare Observation Status Notification Given:  Yes  Obs letter signed and copy given  Claretta Deed 10/02/2023, 12:53 PM

## 2023-10-02 NOTE — Progress Notes (Signed)
 SLP Cancellation Note  Patient Details Name: Cassandra Spears MRN: [id number] DOB: 10-18-1950   Cancelled treatment:       Reason Eval/Treat Not Completed: SLP screened, no needs identified, will sign off. Pt/husband report pt has returned to baseline. No cognitive or communication deficits observed or reported. ST signing off at this time. Please reconsult if needs arise.   [name] B. Dory, MSP, CCC-SLP Speech Language Pathologist Office: [phone]  [name] 10/02/2023, 12:28 PM

## 2023-10-02 NOTE — Progress Notes (Signed)
  Echocardiogram 2D Echocardiogram has been performed.  Tinnie FORBES Gosling RDCS 10/02/2023, 2:24 PM

## 2023-10-02 NOTE — Progress Notes (Signed)
 Routine EEG completed, results pending Neurology review and interpretation

## 2023-10-02 NOTE — Consult Note (Addendum)
 NEUROLOGY CONSULT NOTE   Date of service: October 02, 2023 Patient Name: Cassandra Spears MRN:  [id number] DOB:  28-Mar-1950 Chief Complaint: Transient difficulty speech Requesting Provider: Lou Cassandra HERO, MD  History of Present Illness  Cassandra Spears is a 73 y.o. female with hx of hypertension who presents after transient confusional episode.  She remembers waking up around 11 PM and having trouble getting out of her chair.  She struggled but mated to the bathroom which point she has a fairly long amnestic period.  Her husband states that he found her in the bathroom after hearing sounds of her struggling.  She would only repeat two phrases, what am I supposed to do and where do I get off?  She had no verbal output other than these two phrases.  He was able to help guide her through taking her contacts out, but she had significant difficulty with doing so.  He then was able to get her to go to bed.  She typically uses a hallway bathroom because it is more amenable to using her walker and she has significant hip problems.  Last night, she woke up and said I have to pee before then continuing to repeat the phrases what am I supposed to do and where to get off.  She then got up and tried to go into their room's bathroom which is not a amenable to being used with a walker.  He was able to get her in there but it was a struggle and she used the bathroom and came back to bed.  Sometime thereafter, she again got up to use the bathroom and she was trying to get into their room's bathroom and was having trouble and began pulling her pants down to pee, but he was able to pull up her depends so that she could use that.  She did not speak normally until the next day, he asked her if she would like a couple coffee in the morning and she responded yes that would be nice.  She gradually improved, but was still having difficulty finishing her sentences over the course of the morning until they arrived at  [facility] yesterday afternoon.  She now feels like she is completely back to normal.  She does have significant pain because of her hip, but the only narcotic like medication that she is on is tramadol .  Her husband denies any drug use.  Past History   Past Medical History:  Diagnosis Date   Allergy    Anemia    Arthritis    Asthma    GERD (gastroesophageal reflux disease)    Heart murmur    Hypertension    Osteoporosis     Past Surgical History:  Procedure Laterality Date   BREAST BIOPSY Right 2002   Broken Ankle Right    Uterine Polyp      Family History: Family History  Problem Relation Age of Onset   Colon cancer Neg Hx    Esophageal cancer Neg Hx    Pancreatic cancer Neg Hx    Rectal cancer Neg Hx    Stomach cancer Neg Hx     Social History  reports that she has never smoked. She has never used smokeless tobacco. She reports current alcohol use. She reports that she does not use drugs.  No Known Allergies  Medications   Current Facility-Administered Medications:     stroke: early stages of recovery book, , Does not apply, Once, Amponsah, Cassandra HERO, MD  0.9 %  sodium chloride  infusion, , Intravenous, Continuous, Amponsah, Cassandra HERO, MD, Last Rate: 75 mL/hr at 10/02/23 0233, Rate Change at 10/02/23 0233   acetaminophen  (TYLENOL ) tablet 650 mg, 650 mg, Oral, Q4H PRN, 650 mg at 10-20-23 2352 **OR** acetaminophen  (TYLENOL ) 160 MG/5ML solution 650 mg, 650 mg, Per Tube, Q4H PRN **OR** acetaminophen  (TYLENOL ) suppository 650 mg, 650 mg, Rectal, Q4H PRN, Amponsah, Cassandra HERO, MD   cholecalciferol  (VITAMIN D3) 25 MCG (1000 UNIT) tablet 2,000 Units, 2,000 Units, Oral, Daily, Amponsah, Cassandra HERO, MD   enoxaparin  (LOVENOX ) injection 40 mg, 40 mg, Subcutaneous, Q24H, Amponsah, Cassandra HERO, MD, 40 mg at Oct 20, 2023 2320   fluticasone  (FLONASE ) 50 MCG/ACT nasal spray 1 spray, 1 spray, Each Nare, Daily, Amponsah, Cassandra HERO, MD   fluticasone  furoate-vilanterol (BREO ELLIPTA ) 100-25  MCG/ACT 1 puff, 1 puff, Inhalation, Daily, Amponsah, Cassandra HERO, MD   ibuprofen  (ADVIL ) tablet 600 mg, 600 mg, Oral, TID, Amponsah, Cassandra HERO, MD   lisinopril  (ZESTRIL ) tablet 40 mg, 40 mg, Oral, Daily, Amponsah, Cassandra HERO, MD   loratadine  (CLARITIN ) tablet 10 mg, 10 mg, Oral, Daily, Amponsah, Cassandra HERO, MD   montelukast  (SINGULAIR ) tablet 10 mg, 10 mg, Oral, QHS, Amponsah, Cassandra M, MD, 10 mg at Oct 20, 2023 2347   multivitamin with minerals tablet 1 tablet, 1 tablet, Oral, QODAY, Amponsah, Cassandra HERO, MD   pantoprazole  (PROTONIX ) EC tablet 40 mg, 40 mg, Oral, Daily, Amponsah, Cassandra M, MD   rosuvastatin  (CRESTOR ) tablet 10 mg, 10 mg, Oral, QHS, Amponsah, Cassandra M, MD, 10 mg at October 20, 2023 2347   senna-docusate (Senokot-S) tablet 1 tablet, 1 tablet, Oral, QHS PRN, Cassandra Cassandra HERO, MD   traMADol  (ULTRAM ) tablet 50 mg, 50 mg, Oral, TID PRN, Cassandra Cassandra HERO, MD, 50 mg at 10/02/23 0350  Vitals   Vitals:   2023/10/20 2345 10/02/23 0030 10/02/23 0150 10/02/23 0151  BP: (!) 170/79 (!) 173/84  (!) 179/89  Pulse: 83 86  83  Resp: 13 16  18   Temp:   97.6 F (36.4 C)   TempSrc:   Oral   SpO2: 99% 92%  97%    There is no height or weight on file to calculate BMI.   Physical Exam   Constitutional: Appears well-developed and well-nourished.  Neurologic Examination    Neuro: Mental Status: Patient is awake, alert, oriented to person, place, month, year, and situation. Patient is able to give a clear and coherent history. No signs of aphasia or neglect She is able to spell world backwards, but makes one mistake on serial sevens Cranial Nerves: II: Visual Fields are full. Pupils are equal, round, and reactive to light.   III,IV, VI: EOMI without ptosis or diploplia.  V: Facial sensation is symmetric to temperature VII: Facial movement is symmetric.  VIII: hearing is intact to voice X: Uvula elevates symmetrically XII: tongue is midline without atrophy or fasciculations.  Motor: Tone is  normal. Bulk is normal. 5/5 strength was present in all four extremities.  Sensory: Sensation is symmetric to light touch and temperature in the arms and legs. Cerebellar: FNF and HKS are intact bilaterally      Labs/Imaging/Neurodiagnostic studies   CBC:  Recent Labs  Lab 2023-10-20 1508  WBC 8.3  NEUTROABS 5.6  HGB 12.5  HCT 38.1  MCV 88.8  PLT 316   Basic Metabolic Panel:  Lab Results  Component Value Date   NA 129 (L) Oct 20, 2023   K 4.0 2023/10/20   CO2 24 Oct 20, 2023   GLUCOSE 111 (H) 10/20/2023  BUN 9 10/01/2023   CREATININE 0.38 (L) 10/01/2023   CALCIUM  9.3 10/01/2023   GFRNONAA >60 10/01/2023   GFRAA 133 12/01/2007    INR  Lab Results  Component Value Date   INR 0.9 10/01/2023   APTT  Lab Results  Component Value Date   APTT 26 10/01/2023    CT Head without contrast(Personally reviewed): Negative  CT angio Head and Neck with contrast(Personally reviewed): She has a irregularity in the left carotid artery being read as ulcerated plaque   ASSESSMENT   Cassandra Spears is a 73 y.o. female with a transient confusional episode.  She had significant perseveration with repetition of the same words over and over again.  She is amnestic to most of the episode.  This is unusual for transient ischemic attack.  She is currently exhibiting no signs of delirium, and even with delirium perseveration is not common.  Seizure I think has to be a consideration as this could be responsible for both the perseveration as well as the amnesia.  She does not have any history of episodes of concern prior to this.  TIA could be a consideration if coupled with a mild delirious state as a result of the TIA.  Her sodium is mildly low, but not in the range where I would typically expect acute confusion but it is possible that she was much lower when she was actually experiencing the symptoms.  RECOMMENDATIONS  Would consider discontinuing tramadol  EEG MRI Would consider  discussion with neuro IR regarding her left carotid finding UDS Would continue baby aspirin  Neurology will follow ______________________________________________________________________    Signed, Aisha Seals, MD [facility]

## 2023-10-03 ENCOUNTER — Other Ambulatory Visit (HOSPITAL_COMMUNITY): Payer: Self-pay

## 2023-10-03 DIAGNOSIS — E871 Hypo-osmolality and hyponatremia: Secondary | ICD-10-CM | POA: Diagnosis not present

## 2023-10-03 LAB — HEMOGLOBIN A1C
Hgb A1c MFr Bld: 5.4 % (ref 4.8–5.6)
Mean Plasma Glucose: 108 mg/dL

## 2023-10-08 DIAGNOSIS — R410 Disorientation, unspecified: Secondary | ICD-10-CM | POA: Diagnosis not present

## 2023-10-08 DIAGNOSIS — M87052 Idiopathic aseptic necrosis of left femur: Secondary | ICD-10-CM | POA: Diagnosis not present

## 2023-10-08 DIAGNOSIS — G4730 Sleep apnea, unspecified: Secondary | ICD-10-CM | POA: Diagnosis not present

## 2023-10-08 DIAGNOSIS — R9401 Abnormal electroencephalogram [EEG]: Secondary | ICD-10-CM | POA: Diagnosis not present

## 2023-10-08 DIAGNOSIS — E871 Hypo-osmolality and hyponatremia: Secondary | ICD-10-CM | POA: Diagnosis not present

## 2023-10-14 ENCOUNTER — Ambulatory Visit: Payer: Self-pay | Admitting: Neurology

## 2023-10-14 ENCOUNTER — Encounter: Payer: Self-pay | Admitting: Neurology

## 2023-10-14 VITALS — BP 182/152 | HR 80 | Resp 15

## 2023-10-14 DIAGNOSIS — R4701 Aphasia: Secondary | ICD-10-CM | POA: Diagnosis not present

## 2023-10-14 DIAGNOSIS — R569 Unspecified convulsions: Secondary | ICD-10-CM | POA: Diagnosis not present

## 2023-10-14 MED ORDER — LEVETIRACETAM 500 MG PO TABS: 500.0000 mg | ORAL_TABLET | Freq: Two times a day (BID) | ORAL | 3 refills | Status: AC

## 2023-10-14 NOTE — Progress Notes (Unsigned)
 Chief Complaint  Patient presents with   Pre-op Exam    Rm15, alone, Pt is here for a surgical clearance.     Results    Pt would like a thorough explanation of eeg results    Medication Problem    Pt reported taking singulair  and thinks symptoms have been caused by the black box label. Symptoms include: aphasia, transient alteration of awareness.       ASSESSMENT AND PLAN  Cassandra Spears is a 73 y.o. female   Possible complex partial seizure on September 30, 2023  MRI of the brain showed no significant abnormality  EEG showed intermittent left temporal slowing, worry about postictal,  She was started on Keppra  500 mg twice a day, tolerating it well, no recurrent spells,  This happened in the setting of chronic left hip pain, polypharmacy including tramadol  50 mg up to 3 tablets a day, frequent Tylenol , diclofenac, sleep deprivation,  She is on schedule for elective left hip replacement on October 23, 2023, I see no contraindication for her to proceed with the surgery,  she should keep Keppra  500 mg twice a day  CT angiogram of head and neck also demonstrate left carotid artery ulcerative plaque, with no significant stenosis, she do have vascular risk factor of aging, sedentary lifestyle, hypertension, would suggest antiplatelet agent treatment, understanding it has to be on hold before elective surgery, may restart as soon as deemed to be safe    She was put on no driving until episode free for 6 months, return to clinic in 6 months, may consider repeat EEG/video EEG monitoring.   DIAGNOSTIC DATA (LABS, IMAGING, TESTING) - I reviewed patient records, labs, notes, testing and imaging myself where available.   MEDICAL HISTORY:  Cassandra Spears is a 73 year old female, accompanied by her husband, seen in request by her primary care doctor Hamrick, Charlene for evaluation of confusion, initial evaluation was on October 14, 2023  History is obtained from the patient and review of  electronic medical records. I personally reviewed pertinent available imaging films in PACS.   PMHx of  Asthma Hypertension Osteoporosis GERD  She came in wheelchair, due to severe left hip pain,   is on schedule for left hip placement on October 23, 2023  She was admitted to the hospital on October 01, 2023 after prolonged confusion at the night before  around 11 PM, husband was awakened from commotion in the bathroom, when he attended her, she was noted to be confused, kept repeating question I do not know what to do, she does not know her own name, birthday, does not know how to use her cell phone,  They went to bed, but 2 hours later, when she got up using the bathroom, she remained confused, had her walker stuck in the door, then lose control of her urine, does not know how to wipe down her leg  By about 8 AM next morning, her speech was much clearer, has no clear recollection of the event Over the past few months, she has significant left hip pain, taking tramadol  50 mg up to 3 tablets a day, diclofenac, Tylenol , has difficulty sleeping,  At emergency room hypertensive systolic 160-190  CT head and neck showed 3 x 0 x 2.5 mm ulcerative plaque at left internal carotid artery but no significant stenosis  MRI of the brain showed no acute abnormality  EEG showed intermittent slowing at left temporal region, raise the possibility of seizure, and postictal abnormality, she was  started on Keppra  500 mg twice a day  She tolerated Keppra  well, there was no recurrent confusion or seizure-like episode   PHYSICAL EXAM:   Vitals:   10/14/23 1408 10/14/23 1412  BP: (!) 176/124 (!) 182/152  Pulse:  80  Resp:  15  SpO2:  98%     There is no height or weight on file to calculate BMI.  PHYSICAL EXAMNIATION:  Gen: NAD, conversant, well nourised, well groomed                     Cardiovascular: Regular rate rhythm, no peripheral edema, warm, nontender. Eyes: Conjunctivae clear without  exudates or hemorrhage Neck: Supple, no carotid bruits. Pulmonary: Clear to auscultation bilaterally   NEUROLOGICAL EXAM:  MENTAL STATUS: Speech/cognition: Awake, alert, oriented to history taking and casual conversation, in wheelchair CRANIAL NERVES: CN II: Visual fields are full to confrontation. Pupils are round equal and briskly reactive to light. CN III, IV, VI: extraocular movement are normal. No ptosis. CN V: Facial sensation is intact to light touch CN VII: Face is symmetric with normal eye closure  CN VIII: Hearing is normal to causal conversation. CN IX, X: Phonation is normal. CN XI: Head turning and shoulder shrug are intact  MOTOR: Left lower extremity motor strength is limited due to left hip pain, no muscle weakness otherwise  REFLEXES: Reflexes are 1 and symmetric at the biceps, triceps, knees, and ankles. Plantar responses are flexor.  SENSORY: Intact to light touch, pinprick and vibratory sensation are intact in fingers and toes.  COORDINATION: There is no trunk or limb dysmetria noted.  GAIT/STANCE: Gait is deferred  REVIEW OF SYSTEMS:  Full 14 system review of systems performed and notable only for as above All other review of systems were negative.   ALLERGIES: No Known Allergies  HOME MEDICATIONS: Current Outpatient Medications  Medication Sig Dispense Refill   acetaminophen  (TYLENOL ) 500 MG tablet Take 1,000 mg by mouth 3 (three) times daily as needed for mild pain (pain score 1-3).     albuterol (PROVENTIL) (2.5 MG/3ML) 0.083% nebulizer solution Take 2.5 mg by nebulization every 6 (six) hours as needed for wheezing or shortness of breath.     albuterol (VENTOLIN HFA) 108 (90 Base) MCG/ACT inhaler Inhale 1-2 puffs into the lungs every 6 (six) hours as needed for wheezing or shortness of breath.     Ascorbic Acid (VITAMIN C) 1000 MG tablet Take 1,000 mg by mouth 2 (two) times daily.     Azelaic Acid (FINACEA) 15 % cream Apply topically 2 (two) times  daily. After skin is thoroughly washed and patted dry, gently but thoroughly massage a thin film of azelaic acid cream into the affected area twice daily, in the morning and evening.     betamethasone dipropionate (DIPROLENE) 0.05 % cream Apply topically as needed.     BREO ELLIPTA  100-25 MCG/INH AEPB INL 1 PUFF PO QD  4   cholecalciferol  (VITAMIN D ) 1000 units tablet Take 2,000 Units by mouth daily.     diclofenac (VOLTAREN) 75 MG EC tablet Take 75 mg by mouth 2 (two) times daily.     diclofenac Sodium (VOLTAREN) 1 % GEL Apply 2 g topically 4 (four) times daily.     Docusate Sodium  (COLACE PO) Take 1 tablet by mouth daily as needed (Constipation).     esomeprazole (NEXIUM) 20 MG capsule TK 1 C PO D  7   estradiol (ESTRACE) 0.1 MG/GM vaginal cream Place 1 Applicatorful vaginally at  bedtime.     fexofenadine (ALLEGRA) 180 MG tablet Take 180 mg by mouth daily.     levETIRAcetam  (KEPPRA ) 500 MG tablet Take 1 tablet (500 mg total) by mouth 2 (two) times daily. 180 tablet 0   lisinopril  (ZESTRIL ) 40 MG tablet Take 40 mg by mouth daily.     mometasone (NASONEX) 50 MCG/ACT nasal spray Place 1 spray into the nose daily.  5   montelukast  (SINGULAIR ) 10 MG tablet Take 10 mg by mouth at bedtime.     Multiple Vitamins-Minerals (MULTIVITAMIN ADULTS 50+ PO) Take 1 tablet by mouth every other day.     OVER THE COUNTER MEDICATION Take 1 tablet by mouth daily. Mega Red 500 Mg     OVER THE COUNTER MEDICATION Calcium  Citrate/ vitamin D3/ and Magnesium 4000 mg. Taken 2 times daily.     Probiotic Product (CULTURELLE PROBIOTICS PO) Take 1 tablet by mouth daily.     rosuvastatin  (CRESTOR ) 10 MG tablet Take 10 mg by mouth at bedtime.     traMADol  (ULTRAM ) 50 MG tablet Take 50 mg by mouth 3 (three) times daily as needed.     ZADITOR 0.025 % ophthalmic solution Place 1 drop into both eyes 2 (two) times daily as needed (itchy eyes).  3   Zoledronic  Acid (RECLAST  IV) Inject into the vein See admin instructions. Given once  a year for Osteoporosis     No current facility-administered medications for this visit.    PAST MEDICAL HISTORY: Past Medical History:  Diagnosis Date   Allergy    Anemia    Arthritis    Asthma    GERD (gastroesophageal reflux disease)    Heart murmur    Hypertension    Osteoporosis     PAST SURGICAL HISTORY: Past Surgical History:  Procedure Laterality Date   BREAST BIOPSY Right 2002   Broken Ankle Right    Uterine Polyp      FAMILY HISTORY: Family History  Problem Relation Age of Onset   Colon cancer Neg Hx    Esophageal cancer Neg Hx    Pancreatic cancer Neg Hx    Rectal cancer Neg Hx    Stomach cancer Neg Hx     SOCIAL HISTORY: Social History   Socioeconomic History   Marital status: Married    Spouse name: Not on file   Number of children: Not on file   Years of education: Not on file   Highest education level: Not on file  Occupational History   Not on file  Tobacco Use   Smoking status: Never   Smokeless tobacco: Never  Substance and Sexual Activity   Alcohol use: Yes    Comment: a few times a week wine and beer.   Drug use: No   Sexual activity: Not on file  Other Topics Concern   Not on file  Social History Narrative   Not on file   Social Drivers of Health   Financial Resource Strain: Not on file  Food Insecurity: No Food Insecurity (10/02/2023)   Hunger Vital Sign    Worried About Running Out of Food in the Last Year: Never true    Ran Out of Food in the Last Year: Never true  Transportation Needs: No Transportation Needs (10/02/2023)   PRAPARE - Administrator, Civil Service (Medical): No    Lack of Transportation (Non-Medical): No  Physical Activity: Not on file  Stress: Not on file  Social Connections: Unknown (10/02/2023)   Social Connection and  Isolation Panel    Frequency of Communication with Friends and Family: More than three times a week    Frequency of Social Gatherings with Friends and Family: Three times a week     Attends Religious Services: Not on file    Active Member of Clubs or Organizations: No    Attends Club or Organization Meetings: 1 to 4 times per year    Marital Status: Married  Intimate Partner Violence: Unknown (10/02/2023)   Humiliation, Afraid, Rape, and Kick questionnaire    Fear of Current or Ex-Partner: No    Emotionally Abused: No    Physically Abused: Not on file    Sexually Abused: No      Modena Callander, M.D. Ph.D.  [facility] [address] [place], [place] [address] Ph: [phone] Fax: [phone]  CC:  Rojelio Nest, DO [address] [place],  [place] [address]  Hamrick, Charlene CROME, MD

## 2023-10-16 ENCOUNTER — Encounter: Payer: Self-pay | Admitting: Neurology

## 2023-10-16 ENCOUNTER — Telehealth: Payer: Self-pay

## 2023-10-16 ENCOUNTER — Ambulatory Visit: Admitting: Neurology

## 2023-10-16 VITALS — BP 200/109 | HR 82 | Ht 61.0 in | Wt 198.0 lb

## 2023-10-16 DIAGNOSIS — G454 Transient global amnesia: Secondary | ICD-10-CM | POA: Diagnosis not present

## 2023-10-16 DIAGNOSIS — G4701 Insomnia due to medical condition: Secondary | ICD-10-CM | POA: Diagnosis not present

## 2023-10-16 DIAGNOSIS — R9401 Abnormal electroencephalogram [EEG]: Secondary | ICD-10-CM | POA: Diagnosis not present

## 2023-10-16 DIAGNOSIS — R0683 Snoring: Secondary | ICD-10-CM | POA: Diagnosis not present

## 2023-10-16 DIAGNOSIS — G8929 Other chronic pain: Secondary | ICD-10-CM

## 2023-10-16 MED ORDER — ALPRAZOLAM 0.5 MG PO TABS: 0.5000 mg | ORAL_TABLET | Freq: Every evening | ORAL | 0 refills | Status: DC | PRN

## 2023-10-16 NOTE — Patient Instructions (Signed)
 Surgery Safety When You Have Sleep Apnea Sleep apnea is a condition that affects your breathing while you're sleeping. You may have shallow breathing or stop breathing for short periods of time. Tell your health care team if you have sleep apnea and you're going to have surgery. Some people with sleep apnea don't know that they have it. Sleep apnea can increase your risk of complications during and after surgery. Tell a health care provider about: Any allergies you have. All medicines you take. These include vitamins, herbs, eye drops, and creams. Any bleeding problems you have. Any problems you or family members have had with anesthesia. Any medical conditions you have, especially if you have sleep apnea. Any surgeries you've had. Whether you're pregnant or may be pregnant. What are the risks of sleep apnea during surgery? If sleep apnea isn't treated it can cause problems during and after surgery. Medicine used to relax you during surgery may cause your airway to block the flow of air. Sleep apnea that isn't treated may increase your risk for: A longer stay in the recovery room or hospital. Breathing trouble, such as low oxygen levels after surgery. More pain after surgery. Irregular heart rhythms. Stroke. Heart attack. Tell your team that you have sleep apnea. They can take steps to help prevent complications. What happens before the surgery? Sleep apnea screening Sleep apnea screening is like a short test that helps find out if you have sleep apnea. You may be screened many times before surgery. If you don't know if you have sleep apnea, ask your provider to screen you or you may take the short online test yourself. Some questions you may be asked include: Do you snore? Has a partner or spouse told you that you stop breathing, choke, or gasp during sleep? Is your sleep restless? Do you have daytime sleepiness? Have you had trouble concentrating or memory loss? Answer these questions  honestly. If a screening test is positive, this means you are at risk for the condition. More tests may be needed to confirm a diagnosis of sleep apnea. What happens on the day of surgery? If you have a sleep apnea device, bring it with you to wear after surgery. Wear your sleep apnea device when you are sleeping during your hospital stay, or as told by your provider. What can I expect after the surgery? You may need to be given extra oxygen and wear a continuous oxygen monitor. For your safety, you may need to stay in the recovery room or hospital for longer than usual. If you'll be going home right after the surgery, plan to have a responsible adult: Drive you home from the hospital or clinic. You won't be allowed to drive. Stay with you for the time you're told. Follow these instructions at home: Medicines Take your medicines only as told. Your sleep apnea may get worse if you take prescription pain medicine, sleeping medicine, or medicine that makes you drowsy. Ask about taking pain medicines that do not affect your breathing, such as ibuprofen or acetaminophen. General instructions  If your health care provider says it is OK, raise the head of your bed or lie on your side. Do not lie flat on your back. Follow instructions from your health care provider about wearing your sleep device. Use it anytime you are sleeping, including during daytime naps. Where to find more information To learn more, go to these websites: Centers for Disease Control and Prevention: [employer]. Then: Click Health Topics A-Z. Type "sleep apnea" in  the search box. National Heart, Lung, and Blood Institute: [employer] American Sleep Apnea Association: sleephealth.org Contact a health care provider if: You are scheduled for surgery and have sleep apnea or think you have sleep apnea. Get help right away if: You have trouble breathing. You are very drowsy and can't stay awake. You are told that you have pauses  in your breathing during sleep after surgery. You have chest pain. You have a fast heartbeat. These symptoms may be an emergency. Call 911 right away. Do not wait to see if the symptoms will go away. Do not drive yourself to the hospital. This information is not intended to replace advice given to you by your health care provider. Make sure you discuss any questions you have with your health care provider. Document Revised: 07/20/2022 Document Reviewed: 07/20/2022 Elsevier Patient Education  2024 [employer]   Living With Sleep Apnea Sleep apnea is a condition that affects your breathing while you're sleeping. Your tongue or the tissue in your throat may block the flow of air while you sleep. You may have shallow breathing or stop breathing for short periods of time. The breaks in breathing interrupt the deep sleep that you need to feel rested. Even if you don't wake up from the gaps in breathing, you may feel tired during the day. People with sleep apnea may snore loudly. You may have a headache in the morning and feel anxious or depressed. How can sleep apnea affect me? Sleep apnea increases your chances of being very tired during the day. This is called daytime fatigue. Sleep apnea can also increase your risk of: Heart attack. Stroke. Obesity. Type 2 diabetes. Heart failure. Irregular heartbeat. High blood pressure. If you are very tired during the day, you may be more likely to: Not do well in school or at work. Fall asleep while driving. Have trouble paying attention. Develop depression or anxiety. Have problems having sex. This is called sexual dysfunction. What actions can I take to manage sleep apnea? Sleep apnea treatment  If you were given a device to open your airway while you sleep, use it only as told by your health care provider. You may be given: An oral appliance. This is a mouthpiece that shifts your lower jaw forward. A continuous positive airway pressure  (CPAP) device. This blows air through a mask. A nasal expiratory positive airway pressure (EPAP) device. This has valves that you put into each nostril. A bi-level positive airway pressure (BIPAP) device. This blows air through a mask when you breathe in and breathe out. You may need surgery if other treatments don't work for you. Sleep habits Go to sleep and wake up at the same time every day. This helps set your internal clock for sleeping. If you stay up later than usual on weekends, try to get up in the morning within 2 hours of the time you usually wake up. Try to get at least 7-9 hours of sleep each night. Stop using a computer, tablet, and mobile phone a few hours before bedtime. Do not take long naps during the day. If you nap, limit it to 30 minutes. Have a relaxing bedtime routine. Reading or listening to music may relax you and help you sleep. Use your bedroom only for sleep. Keep your television and computer out of your bedroom. Keep your bedroom cool, dark, and quiet. Use a supportive mattress and pillows. Follow your provider's instructions for other changes to sleep habits. Nutrition Do not eat big meals in  the evening. Do not have caffeine in the later part of the day. The effects of caffeine can last for more than 5 hours. Follow your provider's instructions for any changes to what you eat and drink. Lifestyle Do not drink alcohol before bedtime. Alcohol can cause you to fall asleep at first, but then it can cause you to wake up in the middle of the night and have trouble getting back to sleep. Do not smoke, vape, or use nicotine or tobacco. Medicines Take over-the-counter and prescription medicines only as told by your provider. Do not use over-the-counter sleep medicine. You may become dependent on this medicine, and it can make sleep apnea worse. Do not take medicines, such as sedatives and narcotics, unless told to by your provider. Activity Exercise on most days,  but avoid exercising in the evening. Exercising near bedtime can interfere with sleeping. If possible, spend time outside every day. Natural light helps with your internal clock. General information Lose weight if you need to. Stay at a healthy weight. If you are having surgery, make sure to tell your provider that you have sleep apnea. You may need to bring your device with you. Keep all follow-up visits. Your provider will want to check on your condition. Where to find more information National Heart, Lung, and Blood Institute: [employer] This information is not intended to replace advice given to you by your health care provider. Make sure you discuss any questions you have with your health care provider. Document Revised: 06/05/2022 Document Reviewed: 06/05/2022 Elsevier Patient Education  2024 [employer].

## 2023-10-16 NOTE — Progress Notes (Signed)
 SLEEP MEDICINE CLINIC    Provider:  Dedra Gores, MD  Primary Care Physician:  Stephanie Charlene CROME, MD [address] [place] [address]     Referring Provider:    Dr Onita  is Primary Neurologist / follow up.     Rojelio Nest, Do [address] [place],  [place] [address]          Chief Complaint according to patient   Patient presents with:     New Patient (Initial Visit)           HISTORY OF PRESENT ILLNESS:  Cassandra Spears is a 73 y.o. female patient  and known snorer, retired special ed Runner, broadcasting/film/video, who is seen upon referral  by Dr Onita on 10/16/2023. The patient was in the hospital after having chronic hip pain related insomnia, sleep deprived and became confused on a Tuesday night , she  was not responding to her husbands  questions adequately, repeated herself, didn't know how to use the cell phone, didn't know her name ( GTA) . 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  She presented the next day to the hospital , CT brain normal, has had an EEG and there was an abnormality in the left lobe -  MRI brain was normal.    Chief concern according to patient :     Cassandra Spears is a 73 y.o. female with medical history significant for asthma, HTN, osteoporosis, allergies, GERD,  hip hairline fracture after fall and within the last  2 months developed  hip bone /joint deteriorating  , and osteo arthritis who presented to the ED for evaluation of altered mental status and non- fluent  aphasia.   Patient reports around 11 PM last night, she had trouble getting out of bed.  Spouse reports he held patient to the restroom while in the restroom, she kept repeating 'I do not know what I am supposed to do. I do not know where to get off.  He gave patient a wash rag and she did not know what to do with it. Patient was incoherent all night and could not tell him her name, DOB of current year. Patient was more oriented this morning however she continued to have  difficulty with comprehension and word finding. Patient reports increased urinary frequency at night over the last few weeks and left hip pain currently pending surgery but denies any dysuria, weakness, numbness, tingling, chest pain, vision changes, headache or dizziness. Spouse did not notice any facial droop or slurred speech. Patient reports she is close to her baseline but still has intermittent difficulty with word finding.   ED Course: Initial vitals show afebrile but hypertensive with SBP in the 160s to 190s. Initial labs significant for sodium 129, glucose 111, normal kidney function, normal CBC, normal PT/INR and UA with no signs of infection. EKG shows sinus rhythm with RAE.  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  Pt received aspirin  325 mg x 1, Plavix  300 mg x 1 and IV fentanyl  25 mcg x 1.  Vascular surgery and neurology was consulted for evaluation. TRH was consulted for admission.    MRI brain negative for acute abnormality. Patient underwent EEG which revealed intermittent slowing of the left temporal region.  Case discussed with neurology, they felt that patient's episode was possibly a seizure emanating from the left brain  and the EEG is likely showing postictal slowing.  Patient was started on Keppra  500 mg twice daily with close outpatient neurology.  Per [place]  DMV statutes, patients with seizures are not allowed to drive until they have been seizure-free for six months. Use caution when using heavy equipment or power tools. Avoid working on ladders or at heights. Take showers instead of baths. Ensure the water temperature is not too high on the home water heater. Do not go swimming alone. When caring for infants or small children, sit down when holding, feeding, or changing them to minimize risk of injury to the child in the event you have a seizure. Also, Maintain good sleep hygiene.  Avoid alcohol.      Social history:  Patient is retired from education  and lives in a household with spouse,  who has witnessed apnea and snoring  , Asthma specialist has cleared hr, PCP has cleared her for upcoming hip surgery. She is on 4 different pain medications .  Family history : mother with Alzheimer's,  Tobacco use: none .  ETOH use : 4-6 /month,  Caffeine intake in form of Coffee( 2 cups a day) Soda( 16 pounces ) Tea ( decaff) , no  energy drinks Exercise in form of - limited .         Sleep habits are as follows: The patient's dinner time is between 5-6 PM. The patient goes to bed at 11 - 11. 30 PM and uses no TV in bed- continues to sleep for 3-7 hours, woken by pain all the night-    The preferred sleep position is lateral , with the support of 1-2 pillows.  Dreams are reportedly infrequent.    The patient wakes up spontaneously , she has not been able to sleep deep.  She reports not feeling refreshed or restored in AM, with symptoms such as dry mouth,  hip pain and stiffness, Naps are taken infrequently, can be refreshing .    Review of Systems: Out of a complete 14 system review, the patient complains of only the following symptoms, and all other reviewed systems are negative.:  Fatigue, sleepiness , snoring, fragmented sleep, Insomnia, RLS, Nocturia    How likely are you to doze in the following situations: 0 = not likely, 1 = slight chance, 2 = moderate chance, 3 = high chance   Sitting and Reading? Watching Television? Sitting inactive in a public place (theater or meeting)? As a passenger in a car for an hour without a break? Lying down in the afternoon when circumstances permit? Sitting and talking to someone? Sitting quietly after lunch without alcohol? In a car, while stopped for a few minutes in traffic?   Total = 6/ 24 points   FSS endorsed at 42/ 63 points.   GDS / 15   Social History   Socioeconomic History   Marital status: Married    Spouse  name: Not on file   Number of children: Not on file   Years of education: Not on file   Highest education level: Not on file  Occupational History   Not on file  Tobacco Use   Smoking status: Never   Smokeless tobacco: Never  Substance and Sexual Activity   Alcohol use: Yes    Comment: a few times a week wine and beer.   Drug use: No   Sexual activity: Not on file  Other Topics Concern   Not on file  Social History Narrative   Not on  file   Social Drivers of Health   Financial Resource Strain: Not on file  Food Insecurity: No Food Insecurity (10/02/2023)   Hunger Vital Sign    Worried About Running Out of Food in the Last Year: Never true    Ran Out of Food in the Last Year: Never true  Transportation Needs: No Transportation Needs (10/02/2023)   PRAPARE - Administrator, Civil Service (Medical): No    Lack of Transportation (Non-Medical): No  Physical Activity: Not on file  Stress: Not on file  Social Connections: Unknown (10/02/2023)   Social Connection and Isolation Panel    Frequency of Communication with Friends and Family: More than three times a week    Frequency of Social Gatherings with Friends and Family: Three times a week    Attends Religious Services: Not on file    Active Member of Clubs or Organizations: No    Attends Banker Meetings: 1 to 4 times per year    Marital Status: Married    Family History  Problem Relation Age of Onset   Colon cancer Neg Hx    Esophageal cancer Neg Hx    Pancreatic cancer Neg Hx    Rectal cancer Neg Hx    Stomach cancer Neg Hx     Past Medical History:  Diagnosis Date   Allergy    Anemia    Arthritis    Asthma    GERD (gastroesophageal reflux disease)    Heart murmur    Hypertension    Osteoporosis     Past Surgical History:  Procedure Laterality Date   BREAST BIOPSY Right 2002   Broken Ankle Right    Uterine Polyp       Current Outpatient Medications on File Prior to Visit   Medication Sig Dispense Refill   acetaminophen  (TYLENOL ) 500 MG tablet Take 1,000 mg by mouth 3 (three) times daily as needed for mild pain (pain score 1-3).     albuterol (PROVENTIL) (2.5 MG/3ML) 0.083% nebulizer solution Take 2.5 mg by nebulization every 6 (six) hours as needed for wheezing or shortness of breath.     albuterol (VENTOLIN HFA) 108 (90 Base) MCG/ACT inhaler Inhale 1-2 puffs into the lungs every 6 (six) hours as needed for wheezing or shortness of breath.     Ascorbic Acid (VITAMIN C) 1000 MG tablet Take 1,000 mg by mouth 2 (two) times daily.     Azelaic Acid (FINACEA) 15 % cream Apply topically 2 (two) times daily. After skin is thoroughly washed and patted dry, gently but thoroughly massage a thin film of azelaic acid cream into the affected area twice daily, in the morning and evening.     betamethasone dipropionate (DIPROLENE) 0.05 % cream Apply topically as needed.     BREO ELLIPTA  100-25 MCG/INH AEPB INL 1 PUFF PO QD  4   cholecalciferol  (VITAMIN D ) 1000 units tablet Take 2,000 Units by mouth daily.     diclofenac (VOLTAREN) 75 MG EC tablet Take 75 mg by mouth 2 (two) times daily.     diclofenac Sodium (VOLTAREN) 1 % GEL Apply 2 g topically 4 (four) times daily.     Docusate Sodium  (COLACE PO) Take 1 tablet by mouth daily as needed (Constipation).     esomeprazole (NEXIUM) 20 MG capsule TK 1 C PO D  7   estradiol (ESTRACE) 0.1 MG/GM vaginal cream Place 1 Applicatorful vaginally at bedtime.     fexofenadine (ALLEGRA) 180 MG tablet Take 180  mg by mouth daily.     levETIRAcetam  (KEPPRA ) 500 MG tablet Take 1 tablet (500 mg total) by mouth 2 (two) times daily. 180 tablet 3   lisinopril  (ZESTRIL ) 40 MG tablet Take 40 mg by mouth daily.     mometasone (NASONEX) 50 MCG/ACT nasal spray Place 1 spray into the nose daily.  5   montelukast  (SINGULAIR ) 10 MG tablet Take 10 mg by mouth at bedtime.     Multiple Vitamins-Minerals (MULTIVITAMIN ADULTS 50+ PO) Take 1 tablet by mouth every  other day.     OVER THE COUNTER MEDICATION Take 1 tablet by mouth daily. Mega Red 500 Mg     OVER THE COUNTER MEDICATION Calcium  Citrate/ vitamin D3/ and Magnesium 4000 mg. Taken 2 times daily.     Probiotic Product (CULTURELLE PROBIOTICS PO) Take 1 tablet by mouth daily.     rosuvastatin  (CRESTOR ) 10 MG tablet Take 10 mg by mouth at bedtime.     traMADol  (ULTRAM ) 50 MG tablet Take 50 mg by mouth 3 (three) times daily as needed.     ZADITOR 0.025 % ophthalmic solution Place 1 drop into both eyes 2 (two) times daily as needed (itchy eyes).  3   Zoledronic  Acid (RECLAST  IV) Inject into the vein See admin instructions. Given once a year for Osteoporosis     No current facility-administered medications on file prior to visit.    Allergies  Allergen Reactions   Cat Dander Other (See Comments)   Dog Epithelium (Canis Lupus Familiaris) Other (See Comments)   Molds & Smuts Other (See Comments)   Other Other (See Comments)     DIAGNOSTIC DATA (LABS, IMAGING, TESTING) - I reviewed patient records, labs, notes, testing and imaging myself where available.  Lab Results  Component Value Date   WBC 6.6 10/02/2023   HGB 11.4 (L) 10/02/2023   HCT 33.5 (L) 10/02/2023   MCV 86.8 10/02/2023   PLT 291 10/02/2023      Component Value Date/Time   NA 130 (L) 10/02/2023 0606   K 3.5 10/02/2023 0606   CL 96 (L) 10/02/2023 0606   CO2 26 10/02/2023 0606   GLUCOSE 108 (H) 10/02/2023 0606   BUN 6 (L) 10/02/2023 0606   CREATININE 0.51 10/02/2023 0606   CALCIUM  9.0 10/02/2023 0606   PROT 7.3 10/01/2023 1508   ALBUMIN 4.2 10/01/2023 1508   AST 20 10/01/2023 1508   ALT 16 10/01/2023 1508   ALKPHOS 68 10/01/2023 1508   BILITOT 0.5 10/01/2023 1508   GFRNONAA >60 10/02/2023 0606   GFRAA 133 12/01/2007 1111   Lab Results  Component Value Date   CHOL 166 10/02/2023   HDL 81 10/02/2023   LDLCALC 74 10/02/2023   TRIG 53 10/02/2023   CHOLHDL 2.0 10/02/2023   Lab Results  Component Value Date    HGBA1C 5.4 10/02/2023   No results found for: VITAMINB12 No results found for: TSH  PHYSICAL EXAM:  Today's Vitals   10/16/23 1155  BP: (!) 200/109  Pulse: 82  Weight: 198 lb (89.8 kg)  Height: 5' 1 (1.549 m)   Body mass index is 37.41 kg/m.   Wt Readings from Last 3 Encounters:  10/16/23 198 lb (89.8 kg)  03/25/23 217 lb (98.4 kg)  09/24/16 223 lb (101.2 kg)     Ht Readings from Last 3 Encounters:  10/16/23 5' 1 (1.549 m)  09/24/16 5' 3 (1.6 m)  09/18/16 5' 3 (1.6 m)      General: The patient is  awake, alert and appears not in acute distress. The patient is well groomed. Head: Normocephalic, atraumatic.  Neck is supple. Mallampati 3,  neck circumference:14.5  inches .  Nasal airflow fully patent.    Dental status:  biological , native  Cardiovascular:  Regular rate and cardiac rhythm by pulse,  without distended neck veins. Respiratory: Lungs are clear to auscultation.  Skin:  Without evidence of ankle edema, or rash. Trunk: The patient's posture is erect.   NEUROLOGIC EXAM: The patient is awake and alert, oriented to place and time.   Memory subjective described as intact.  Attention span & concentration ability appears reduced  Speech is fluent,  she speaks louder than her husband , without  dysarthria, dysphonia or aphasia.  Mood and affect are appropriate.   Cranial nerves: no loss of smell or taste reported  Pupils are equal and briskly reactive to light. Extraocular movements in vertical and horizontal planes were intact and without nystagmus. No Diplopia. Visual fields by finger perimetry are intact. Hearing was impaired  to soft voice .  Facial sensation intact to fine touch.  Facial motor strength is symmetric and tongue and uvula move midline.  Neck ROM : Rotation, tilt and flexion extension were normal for age and shoulder shrug was symmetrical.    Motor exam:  Symmetric bulk, tone and ROM for upper extremities. .   Normal tone without cog-  wheeling, symmetric grip strength .   Sensory:  normal.   Coordination: Rapid alternating movements in the fingers/hands were of normal speed.  The Finger-to-nose maneuver with evidence of mild dysmetria, no tremor.   Gait and station:  wheelchair.     ASSESSMENT AND PLAN 73 y.o. year old female  here with:    1) possible sleep apnea - but her recent episode was not precipitated by a concern of sleep apnea..  I will order a HST , doubt we get CPAP 9 if needed ) in time. Will  order  0.5 mg xanax  as a sleep aid for the patient, #5.    2) pain related insomnia and sleep deprivation  with very high BP - in pain- leading to a transient global amnesia  episode .   3)  sleep deprivation was possibly lowering the seizure threshold, as can be pain medication ( tramadol  ! )  - enkephalopathy  would not lead to slowing in the left brain  by EEG.    HST per mail order .   Per [place]  DMV statutes, patients with seizures are not allowed to drive until they have been seizure-free for six months. Use caution when using heavy equipment or power tools. Avoid working on ladders or at heights. Take showers instead of baths. Ensure the water temperature is not too high on the home water heater. Do not go swimming alone. When caring for infants or small children, sit down when holding, feeding, or changing them to minimize risk of injury to the child in the event you have a seizure. Also, Maintain good sleep hygiene. Avoid alcohol.     PS : She was admitted to the hospital on October 01, 2023, 2 to 3 days prior to that she has significant difficulty sleeping due to severe left hip pain, osteoarthritis,    At emergency room hypertensive systolic 160-190 mm hg.  CT head and neck showed 3 x 0 x 2.5 mm ulcerative plaque at left internal carotid artery but no significant stenosis. Started ASA  MRI of the brain showed no acute  abnormality   EEG showed intermittent slowing at left temporal region,  raising the possibility of a seizure- may reflect  postictal abnormality,  she was started on Keppra  500 mg twice a day and will be maintained for 6 months.   See Dr. Georgianne note:  Dr Onita wants to repeat EEG and we may be able to wean her off Keppra  . Patient was asked to stay on baby ASA  until 6 days before surgery, avoid tramadol .   She will only need to follow up with me if the HST reveals a sleep disorder        I would like to thank Dr Onita  for allowing me to meet with and to take care of this pleasant patient.   Discussion of sleep hygiene setting bedtime and rise time,  hot shower  before bed time, no screen light in the bedroom, the bedroom should be cool, quiet and dark. Night lights should illuminate the floor not shine into your eyes. Golden glow  light is less intrusive than blue or cold light.  Read in a book with pages, not on a device. Consider audio books and soothing  sound -scapes.   After spending a total time of  40  minutes face to face and additional time for physical and neurologic examination, review of laboratory studies,  personal review of imaging studies, reports and results of other testing and review of referral information / records as far as provided in visit,   Electronically signed by: Dedra Gores, MD 10/16/2023 12:17 PM  [facility] Sleep Board certified by The [employer] of Sleep Medicine and Diplomate of the [employer] of Sleep Medicine. Board certified In Neurology through the ABPN, Fellow of the [employer] of Neurology.

## 2023-10-17 NOTE — Addendum Note (Signed)
 Addended by: Naome Brigandi on: 10/17/2023 01:25 PM   Modules accepted: Level of Service

## 2023-11-07 DIAGNOSIS — E871 Hypo-osmolality and hyponatremia: Secondary | ICD-10-CM | POA: Diagnosis not present

## 2023-11-11 ENCOUNTER — Ambulatory Visit (INDEPENDENT_AMBULATORY_CARE_PROVIDER_SITE_OTHER): Admitting: Neurology

## 2023-11-11 DIAGNOSIS — G4733 Obstructive sleep apnea (adult) (pediatric): Secondary | ICD-10-CM

## 2023-11-11 DIAGNOSIS — G454 Transient global amnesia: Secondary | ICD-10-CM

## 2023-11-11 DIAGNOSIS — G8929 Other chronic pain: Secondary | ICD-10-CM

## 2023-11-11 DIAGNOSIS — G4736 Sleep related hypoventilation in conditions classified elsewhere: Secondary | ICD-10-CM

## 2023-11-11 DIAGNOSIS — R9401 Abnormal electroencephalogram [EEG]: Secondary | ICD-10-CM

## 2023-11-11 DIAGNOSIS — R0683 Snoring: Secondary | ICD-10-CM

## 2023-11-11 NOTE — Care Plan (Signed)
 Ortho Bundle Case Management Note  Patient Details  Name: Cassandra Spears MRN: [id number] Date of Birth: 16-Apr-1950  spoke with patient. she will discharge to home with family to assist. she has a rolling walker. OPPT set with [facility] [place]. discharge instructions discussed and mailed. she is currently using a W/C due to pain. encouraged that she needs to walk post op. discussed. Patient and MD in agreement with plan. Choice offered                   DME Arranged:    DME Agency:     HH Arranged:    HH Agency:     Additional Comments: Please contact me with any questions of if this plan should need to change.  Charlies Pitch,  RN,BSN,MHA,CCM  [facility] Orthopaedic Specialist  [phone] 11/11/2023, 4:50 PM

## 2023-11-13 NOTE — Progress Notes (Signed)
 [facility]  Cassandra Spears 73 year old female 09/05/1950   HOME SLEEP TEST REPORT ( by Watch PAT)   STUDY DATE: 11-11-23 (11-13-2023)    ORDERING CLINICIAN: Dedra Gores, MD  REFERRING CLINICIAN: Dr Onita Duos, MD PhD  PCP  Dr Stephanie, MD , Dr Rojelio, DO    CLINICAL INFORMATION/HISTORY: this patient suffered a possible seizure or TIA , was evaluated in ED on 10-01-2023 and seen by Dr Onita on 10-14-2023, the differential dx included seizure and TGA.  She reportedly told Dr Onita that she needs surgical clearance by neurology.   Cassandra Spears is a 73 y.o. female and known snorer, a retired Pension scheme manager, who is seen upon referral by Dr Onita on 10/16/2023.  The patient was in the hospital after having chronic hip pain related insomnia, was severely sleep deprived and became confused on a Tuesday night , she was not responding to her husbands  questions adequately, she repeated herself, didn't know how to use the cell phone, didn't know her name ( GTA) . 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  She presented only the next day to the hospital , CT brain was normal, has had an EEG and there was reportedly an abnormality in the left hemisphere- the MRI brain was normal.  See Dr. Georgianne note:  Dr Onita wants to repeat EEG and she may be able to wean her off Keppra  . Patient was asked to stay on baby ASA  until 6 days before surgery, avoid tramadol .    She will only need to follow up with me if the HST reveals a sleep disorder        Epworth sleepiness score:  6/ 24 points   FSS endorsed at 42/ 63 points.    GDS 4/ 15    BMI: 37.4 kg/m   Neck Circumference:  14.5    Sleep Summary:   Total Recording Time (hours, min):   9 hours 3 minutes  Total Sleep Time (hours, min):     7 hours 59 minutes            Percent REM (%):    19%                                    Respiratory Indices:   Calculated pAHI (per CMS guideline): Total AHI  by CMS criteria was 46.6/h, which places this patient into the severe sleep apnea category.                        REM pAHI:   64.3/h                                              NREM pAHI: 44.8/h                            Positional AHI:   All sleep supine Snoring:     Mean volume of 56 dB  (!!!)  Oxygen Saturation Statistics:   Oxygen Saturation (%) Mean:   The mean oxygen saturation was 93% and varied between the nadir at 72 with a maximum saturation of 100%.  Hypoxia time total under 89% saturation was 30 minutes and hypoxia under 90% saturation was 42 minutes.  The hypoxic burden was 94 events per hour. This is clinically significant.            Pulse Rate Statistics:   Pulse Mean (bpm):    Mean heart rate 75 bpm, varying between 63 and 103 bpm.                      IMPRESSION:  This HST confirms the presence of severe sleep apnea which is accentuated by REM sleep but not REM sleep dependent.  This apnea was associated with clinically significant degrees of hypoxemia.  This patient should be on positive airway pressure therapy before undergoing any surgical procedure under anesthesia.     RECOMMENDATION:  I will initiate an in-lab sleep study for this patient to titrate her to positive airway pressure and possibly, if needed, to oxygen.  I would want this procedure to be done and the patient on therapy before she has any surgical procedure.  I will share my observation with Dr. Onita, and the patient's primary care physician.   I would also ask her primary care physician to initiate a weight loss regimen with the patient. This study cannot rule out that the patient had a seizure or a  mini stroke , but it is possible  that this degree of sleep apnea and hypoxia could have significantly contributed to her confusional state.    Any patient should be cautioned not to drive, work at heights, or operate dangerous or heavy equipment when tired  or sleepy.   Review of good sleep hygiene measures is accessible to any sleep clinic patient and can be reiterated through online material- I we recommend the Guide to better Sleep   by the NIH.   Weight loss and Core Strength improvement is highly recommended for individuals with low muscle tone and/ or a BMI over 30.     Any CPAP patient should be reminded to be fully compliant with PAP therapy , (defined as using PAP therapy for more than 4 hours each night ) with the goal to improve sleep related symptoms and decrease long term cardiovascular risks. Any PAP therapy patient should be reminded, that it may take up to 3 months to get fully used to using PAP and it may take 1-2 weeks for an established CPAP user to acclimatize to changes in pressure or mask. The earlier full compliance is achieved, the better long term compliance tends to be.   Please note that untreated obstructive sleep apnea may carry additional perioperative morbidity. Patients with significant obstructive sleep apnea should receive perioperative PAP therapy and the surgical team should be informed of the diagnosis and degree of sleep disordered breathing.  Sleep fragmentation in the presence of normal proportional sleep stages is a nonspecific findings and per se does not signify an intrinsic sleep disorder or a cause for the patient's sleep-related symptoms.  Causes include (but are not limited to) the unfamiliarity of sleeping while recorded by HST device or sleeping in a sleep lab for a full Polysomnography sleep study, but also circadian rhythm disturbances, medication side effects or an underlying mood disorder or medical problem.   The referring physician will be notified of the test results.  INTERPRETING PHYSICIAN:   Dedra Gores, MD  [facility] Sleep Board certified by The [employer] of Sleep Medicine and Diplomate of the [employer] of Sleep Medicine. Board  certified In Neurology through the ABPN, Fellow of the [employer] of Neurology.

## 2023-11-16 ENCOUNTER — Ambulatory Visit: Payer: Self-pay | Admitting: Neurology

## 2023-11-16 DIAGNOSIS — G4733 Obstructive sleep apnea (adult) (pediatric): Secondary | ICD-10-CM | POA: Insufficient documentation

## 2023-11-16 DIAGNOSIS — G4736 Sleep related hypoventilation in conditions classified elsewhere: Secondary | ICD-10-CM | POA: Insufficient documentation

## 2023-11-16 MED ORDER — ALPRAZOLAM 0.5 MG PO TABS: 0.5000 mg | ORAL_TABLET | Freq: Every evening | ORAL | 0 refills | Status: DC | PRN

## 2023-11-16 NOTE — Procedures (Signed)
 [facility]  Cassandra Spears 73 year old female 09/05/1950   HOME SLEEP TEST REPORT ( by Watch PAT)   STUDY DATE: 11-11-23 (11-13-2023)    ORDERING CLINICIAN: Dedra Gores, MD  REFERRING CLINICIAN: Dr Onita Duos, MD PhD  PCP  Dr Stephanie, MD , Dr Rojelio, DO    CLINICAL INFORMATION/HISTORY: this patient suffered a possible seizure or TIA , was evaluated in ED on 10-01-2023 and seen by Dr Onita on 10-14-2023, the differential dx included seizure and TGA.  She reportedly told Dr Onita that she needs surgical clearance by neurology.   Cassandra Spears is a 73 y.o. female and known snorer, a retired [employer] manager, who is seen upon referral by Dr Onita on 10/16/2023.  The patient was in the hospital after having chronic hip pain related insomnia, was severely sleep deprived and became confused on a Tuesday night , she was not responding to her husbands  questions adequately, she repeated herself, didn't know how to use the cell phone, didn't know her name ( [place]) . 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  She presented only the next day to the hospital , CT brain was normal, has had an EEG and there was reportedly an abnormality in the left hemisphere- the MRI brain was normal.  See Dr. Georgianne note:  Dr Onita wants to repeat EEG and she may be able to wean her off Keppra  . Patient was asked to stay on baby ASA  until 6 days before surgery, avoid tramadol .    She will only need to follow up with me if the HST reveals a sleep disorder        Epworth sleepiness score:  6/ 24 points   FSS endorsed at 42/ 63 points.    GDS 4/ 15    BMI: 37.4 kg/m   Neck Circumference:  14.5    Sleep Summary:   Total Recording Time (hours, min):   9 hours 3 minutes  Total Sleep Time (hours, min):     7 hours 59 minutes            Percent REM (%):    19%                                    Respiratory Indices:   Calculated pAHI (per CMS guideline): Total AHI  by CMS criteria was 46.6/h, which places this patient into the severe sleep apnea category.                        REM pAHI:   64.3/h                                              NREM pAHI: 44.8/h                            Positional AHI:   All sleep supine Snoring:     Mean volume of 56 dB  (!!!)  Oxygen Saturation Statistics:   Oxygen Saturation (%) Mean:   The mean oxygen saturation was 93% and varied between the nadir at 72 with a maximum saturation of 100%.  Hypoxia time total under 89% saturation was 30 minutes and hypoxia under 90% saturation was 42 minutes.  The hypoxic burden was 94 events per hour. This is clinically significant.            Pulse Rate Statistics:   Pulse Mean (bpm):    Mean heart rate 75 bpm, varying between 63 and 103 bpm.                      IMPRESSION:  This HST confirms the presence of severe sleep apnea which is accentuated by REM sleep but not REM sleep dependent.  This apnea was associated with clinically significant degrees of hypoxemia.  This patient should be on positive airway pressure therapy before undergoing any surgical procedure under anesthesia.     RECOMMENDATION:  I will initiate an in-lab sleep study for this patient to titrate her to positive airway pressure and possibly, if needed, to oxygen.  I would want this procedure to be done and the patient on therapy before she has any surgical procedure.  I will share my observation with Dr. Onita, and the patient's primary care physician.   I would also ask her primary care physician to initiate a weight loss regimen with the patient. This study cannot rule out that the patient had a seizure or a  mini stroke , but it is possible  that this degree of sleep apnea and hypoxia could have significantly contributed to her confusional state.    Any patient should be cautioned not to drive, work at heights, or operate dangerous or heavy equipment when tired  or sleepy.   Review of good sleep hygiene measures is accessible to any sleep clinic patient and can be reiterated through online material- I we recommend the Guide to better Sleep   by the NIH.   Weight loss and Core Strength improvement is highly recommended for individuals with low muscle tone and/ or a BMI over 30.     Any CPAP patient should be reminded to be fully compliant with PAP therapy , (defined as using PAP therapy for more than 4 hours each night ) with the goal to improve sleep related symptoms and decrease long term cardiovascular risks. Any PAP therapy patient should be reminded, that it may take up to 3 months to get fully used to using PAP and it may take 1-2 weeks for an established CPAP user to acclimatize to changes in pressure or mask. The earlier full compliance is achieved, the better long term compliance tends to be.   Please note that untreated obstructive sleep apnea may carry additional perioperative morbidity. Patients with significant obstructive sleep apnea should receive perioperative PAP therapy and the surgical team should be informed of the diagnosis and degree of sleep disordered breathing.  Sleep fragmentation in the presence of normal proportional sleep stages is a nonspecific findings and per se does not signify an intrinsic sleep disorder or a cause for the patient's sleep-related symptoms.  Causes include (but are not limited to) the unfamiliarity of sleeping while recorded by HST device or sleeping in a sleep lab for a full Polysomnography sleep study, but also circadian rhythm disturbances, medication side effects or an underlying mood disorder or medical problem.   The referring physician will be notified of the test results.  INTERPRETING PHYSICIAN:   Dedra Gores, MD  [facility] Sleep Board certified by The [employer] of Sleep Medicine and Diplomate of the [employer] of Sleep Medicine. Board  certified In Neurology through the ABPN, Fellow of the [employer] of Neu[employer].

## 2023-11-17 NOTE — Progress Notes (Signed)
 Anesthesia Chart Review   Case: [id number] Date/Time: 11/25/23 1128   Procedure: ARTHROPLASTY, HIP, TOTAL,POSTERIOR APPROACH (Left: Hip)   Anesthesia type: Spinal   Diagnosis: Primary osteoarthritis of left hip [M16.12]   Pre-op diagnosis: osteoarthritis of left hip   Location: [facility] ROOM 08 / [facility]   Surgeons: Josefina Chew, MD       DISCUSSION:73 y.o. never smoker with h/o HTN, asthma, severe sleep apnea, left hip OA scheduled for above procedure 11/25/2023 with Dr. Chew Josefina.   Pt with recent admission 8/6-10/02/2023.  Presented to ED with altered mental status and aphasia. Neurology felt patient's episode was possible a seizure  in setting up chronic left hip pain, polypharmacy, sleep deprivation. She was started on Keppra .   Follow up with neurology 10/14/2023. Pt tolerating Keppra  with no recurrent confusion or seizure like episodes. Per note, She is on schedule for elective left hip replacement on October 23, 2023, I see no contraindication for her to proceed with the surgery, she should keep Keppra  500 mg twice a day.  A sleep study was ordered at this visit as well.   Pt underwent home sleep study.  Per Dr. Chalice notes regarding results, I will initiate an in-lab sleep study for this patient to titrate her to positive airway pressure and possibly, if needed, to oxygen.  I would want this procedure to be done and the patient on therapy before she has any surgical procedure.  I will share my observation with Dr. Onita, and the patient's primary care physician.  I would also ask her primary care physician to initiate a weight loss regimen with the patient. This study cannot rule out that the patient had a seizure or a  mini stroke , but it is possible  that this degree of sleep apnea and hypoxia could have significantly contributed to her confusional state.  Discussed with Dr. Keneth, ok to proceed.  VS: There were no vitals taken for this visit.  PROVIDERS: Hamrick, Maura L,  MD   LABS: labs pending (all labs ordered are listed, but only abnormal results are displayed)  Labs Reviewed - No data to display   IMAGES:   EKG:   CV:  Past Medical History:  Diagnosis Date   Allergy    Anemia    Arthritis    Asthma    GERD (gastroesophageal reflux disease)    Heart murmur    Hypertension    Osteoporosis     Past Surgical History:  Procedure Laterality Date   BREAST BIOPSY Right 2002   Broken Ankle Right    Uterine Polyp      MEDICATIONS:  acetaminophen  (TYLENOL ) 500 MG tablet   albuterol (PROVENTIL) (2.5 MG/3ML) 0.083% nebulizer solution   albuterol (VENTOLIN HFA) 108 (90 Base) MCG/ACT inhaler   ALPRAZolam  (XANAX ) 0.5 MG tablet   Ascorbic Acid (VITAMIN C) 1000 MG tablet   Azelaic Acid (FINACEA) 15 % cream   betamethasone dipropionate (DIPROLENE) 0.05 % cream   BREO ELLIPTA  100-25 MCG/INH AEPB   cholecalciferol  (VITAMIN D ) 1000 units tablet   diclofenac (VOLTAREN) 75 MG EC tablet   diclofenac Sodium (VOLTAREN) 1 % GEL   Docusate Sodium  (COLACE PO)   esomeprazole (NEXIUM) 20 MG capsule   estradiol (ESTRACE) 0.1 MG/GM vaginal cream   fexofenadine (ALLEGRA) 180 MG tablet   levETIRAcetam  (KEPPRA ) 500 MG tablet   lisinopril  (ZESTRIL ) 40 MG tablet   mometasone (NASONEX) 50 MCG/ACT nasal spray   montelukast  (SINGULAIR ) 10 MG tablet   Multiple Vitamins-Minerals (  MULTIVITAMIN ADULTS 50+ PO)   OVER THE COUNTER MEDICATION   OVER THE COUNTER MEDICATION   Probiotic Product (CULTURELLE PROBIOTICS PO)   rosuvastatin  (CRESTOR ) 10 MG tablet   traMADol  (ULTRAM ) 50 MG tablet   ZADITOR 0.025 % ophthalmic solution   Zoledronic  Acid (RECLAST  IV)   No current facility-administered medications for this encounter.     Harlene Hoots Ward, PA-C [facility] Pre-Surgical Testing [phone]

## 2023-11-18 ENCOUNTER — Other Ambulatory Visit: Payer: Self-pay | Admitting: Neurology

## 2023-11-18 DIAGNOSIS — G454 Transient global amnesia: Secondary | ICD-10-CM

## 2023-11-18 DIAGNOSIS — G4733 Obstructive sleep apnea (adult) (pediatric): Secondary | ICD-10-CM

## 2023-11-18 DIAGNOSIS — R9401 Abnormal electroencephalogram [EEG]: Secondary | ICD-10-CM

## 2023-11-18 NOTE — Progress Notes (Signed)
 I can put her on auto CPAP ASAP,and  follow with an in lab study?  Orth surgery  hip is waiting for her to start urgently !

## 2023-11-20 ENCOUNTER — Encounter: Payer: Self-pay | Admitting: Neurology

## 2023-11-20 NOTE — Progress Notes (Signed)
 COVID Vaccine received:  []  No [x]  Yes Date of any COVID positive Test in last 90 days: none  PCP - Charlene Single, MD Cardiologist - none Neurology- Dr. Modena Callander,  Dedra Gores, MD   Chest x-ray -  EKG -  10-03-2023 Epic Stress Test -  ECHO - 10-02-2023  Epic Cardiac Cath -  CT Coronary Calcium  score:   Pacemaker / ICD device [x]  No []  Yes   Spinal Cord Stimulator:[x]  No []  Yes       History of Sleep Apnea? []  No [x]  Yes   CPAP used?- [x]  No []  Yes  still be working up   Patient has: [x]  NO Hx DM   []  Pre-DM   []  DM1  []   DM2 Does the patient monitor blood sugar?   [x]  N/A   []  No []  Yes  Last A1c was: 5.4 on   10-02-23    Blood Thinner / Instructions:  none Aspirin  Instructions:  ASA 81 mg  Stopped 11-18-2023  Dental hx: []  Dentures:  [x]  N/A      []  Bridge or Partial:                   []  Loose or Damaged teeth:   Comments: Recently admission, 10-01-23 to 10-02-23 for seizure vs CVA.   Activity level: Able to walk up 2 flights of stairs without becoming significantly short of breath or having chest pain?  []  No   [x]    Yes  Patient can perform ADLs without assistance. []  No   [x]   Yes  Anesthesia review: HTN, asthma, GERD, anemia, seizure (10-01-23), OSA-no CPAP yet, dx w/ murmur in 1994 (ECHO 10-02-23)  Patient denies any S&S of respiratory illness or Covid - no shortness of breath, fever, cough or chest pain at PAT appointment.  Patient verbalized understanding and agreement to the Pre-Surgical Instructions that were given to them at this PAT appointment. Patient was also educated of the need to review these PAT instructions again prior to her surgery.I reviewed the appropriate phone numbers to call if they have any and questions or concerns.

## 2023-11-20 NOTE — Patient Instructions (Addendum)
 SURGICAL WAITING ROOM VISITATION Patients having surgery or a procedure may have no more than 2 support people in the waiting area - these visitors may rotate in the visitor waiting room.   If the patient needs to stay at the hospital during part of their recovery, the visitor guidelines for inpatient rooms apply.  PRE-OP VISITATION  Pre-op nurse will coordinate an appropriate time for 1 support person to accompany the patient in pre-op.  This support person may not rotate.  This visitor will be contacted when the time is appropriate for the visitor to come back in the pre-op area.  Please refer to the [facility] website for the visitor guidelines for Inpatients (after your surgery is over and you are in a regular room).  You are not required to quarantine at this time prior to your surgery. However, you must do this: Hand Hygiene often Do NOT share personal items Notify your provider if you are in close contact with someone who has COVID or you develop fever 100.4 or greater, new onset of sneezing, cough, sore throat, shortness of breath or body aches.  If you test positive for Covid or have been in contact with anyone that has tested positive in the last 10 days please notify you surgeon.    Your procedure is scheduled on:  TUESDAY  11-25-2023  Report to [facility] Main Entrance: [address] entrance where the [employer] is available.   Report to admitting at: 06:45    AM  Call this number if you have any questions or problems the morning of surgery [phone]  Do not eat food after Midnight the night prior to your surgery/procedure.  After Midnight you may have the following liquids until   06:15 AM DAY OF SURGERY  Clear Liquid Diet Water Black Coffee (sugar ok, NO MILK/CREAM OR CREAMERS)  Tea (sugar ok, NO MILK/CREAM OR CREAMERS) regular and decaf                             Plain Jell-O  with no fruit (NO RED)                                           Fruit ices (not  with fruit pulp, NO RED)                                     Popsicles (NO RED)                                                                  Juice: NO CITRUS JUICES: only apple, WHITE grape, WHITE cranberry Sports drinks like Gatorade or Powerade (NO RED)                   The day of surgery:  Drink ONE (1) Pre-Surgery Clear Ensure at    06:15 AM the morning of surgery. Drink in one sitting. Do not sip.  This drink was given to you during your hospital pre-op appointment visit. Nothing else to drink after  completing the Pre-Surgery Clear Ensure : No candy, chewing gum or throat lozenges.    FOLLOW ANY ADDITIONAL PRE OP INSTRUCTIONS YOU RECEIVED FROM YOUR SURGEON'S OFFICE!!!   Oral Hygiene is also important to reduce your risk of infection.        Remember - BRUSH YOUR TEETH THE MORNING OF SURGERY WITH YOUR REGULAR TOOTHPASTE  Do NOT smoke after Midnight the night before surgery.  STOP TAKING all Vitamins, Herbs and supplements 1 week before your surgery.   ASPIRIN - ????  Take ONLY these medicines the morning of surgery with A SIP OF WATER: fexofenadine, Levetiracetam  (Keppra ), and you may take EITHER Tylenol  OR Tramadol  depending on pain level. You may use your Eye drops and the Inhalers / nebulizer if needed. Please bring your Albuterol inhaler with you on the day of surgery.  DO NOT TAKE LISINOPRIL  the morning of your surgery.  If You have been diagnosed with Sleep Apnea - Bring CPAP mask and tubing day of surgery. We will provide you with a CPAP machine on the day of your surgery.                   You may not have any metal on your body including hair pins, jewelry, and body piercing  Do not wear make-up, lotions, powders, perfumes or deodorant  Do not wear nail polish including gel and S&S, artificial / acrylic nails, or any other type of covering on natural nails including finger and toenails. If you have artificial nails, gel coating, etc., that needs to be removed by a  nail salon, Please have this removed prior to surgery. Not doing so may mean that your surgery could be cancelled or delayed if the Surgeon or anesthesia staff feels like they are unable to monitor you safely.   Do not shave 48 hours prior to surgery to avoid nicks in your skin which may contribute to postoperative infections.   Contacts, Hearing Aids, dentures or bridgework may not be worn into surgery. DENTURES WILL BE REMOVED PRIOR TO SURGERY PLEASE DO NOT APPLY Poly grip OR ADHESIVES!!!  Patients discharged on the day of surgery will not be allowed to drive home.  Someone NEEDS to stay with you for the first 24 hours after anesthesia.  Do not bring your home medications to the hospital. The Pharmacy will dispense medications listed on your medication list to you during your admission in the Hospital.  Please read over the following fact sheets you were given: IF YOU HAVE QUESTIONS ABOUT YOUR PRE-OP INSTRUCTIONS, PLEASE CALL [phone].     Pre-operative 5 CHG Bath Instructions   You can play a key role in reducing the risk of infection after surgery. Your skin needs to be as free of germs as possible. You can reduce the number of germs on your skin by washing with CHG (chlorhexidine gluconate) soap before surgery. CHG is an antiseptic soap that kills germs and continues to kill germs even after washing.   DO NOT use if you have an allergy to chlorhexidine/CHG or antibacterial soaps. If your skin becomes reddened or irritated, stop using the CHG and notify one of our RNs at [phone]  Please shower with the CHG soap starting 4 days before surgery using the following schedule: START SHOWERS ON FRIDAY  11-21-23  Please keep in mind the following:  DO NOT shave, including legs and underarms, starting the day of your  first shower.   You may shave your face at any point before/day of surgery.   Place clean sheets on your bed the day you start using CHG soap. Use a clean washcloth (not used since being washed) for each shower. DO NOT sleep with pets once you start using the CHG.   CHG Shower Instructions:  If you choose to wash your hair and private area, wash first with your normal shampoo/soap.  After you use shampoo/soap, rinse your hair and body thoroughly to remove shampoo/soap residue.  Turn the water OFF and apply about 3 tablespoons (45 ml) of CHG soap to a CLEAN washcloth.  Apply CHG soap ONLY FROM YOUR NECK DOWN TO YOUR TOES (washing for 3-5 minutes)  DO NOT use CHG soap on face, private areas, open wounds, or sores.  Pay special attention to the area where your surgery is being performed.  If you are having back surgery, having someone wash your back for you may be helpful.  Wait 2 minutes after CHG soap is applied, then you may rinse off the CHG soap.  Pat dry with a clean towel  Put on clean clothes/pajamas   If you choose to wear lotion, please use ONLY the CHG-compatible lotions on the back of this paper.     Additional instructions for the day of surgery: DO NOT APPLY any lotions, deodorants, cologne, or perfumes.   Put on clean/comfortable clothes.  Brush your teeth.  Ask your nurse before applying any prescription medications to the skin.      CHG Compatible Lotions   Aveeno Moisturizing lotion  Cetaphil Moisturizing Cream  Cetaphil Moisturizing Lotion  Clairol Herbal Essence Moisturizing Lotion, Dry Skin  Clairol Herbal Essence Moisturizing Lotion, Extra Dry Skin  Clairol Herbal Essence Moisturizing Lotion, Normal Skin  Curel Age Defying Therapeutic Moisturizing Lotion with Alpha Hydroxy  Curel Extreme Care Body Lotion  Curel Soothing Hands Moisturizing Hand Lotion  Curel Therapeutic Moisturizing Cream, Fragrance-Free  Curel Therapeutic Moisturizing Lotion,  Fragrance-Free  Curel Therapeutic Moisturizing Lotion, Original Formula  Eucerin Daily Replenishing Lotion  Eucerin Dry Skin Therapy Plus Alpha Hydroxy Crme  Eucerin Dry Skin Therapy Plus Alpha Hydroxy Lotion  Eucerin Original Crme  Eucerin Original Lotion  Eucerin Plus Crme Eucerin Plus Lotion  Eucerin TriLipid Replenishing Lotion  Keri Anti-Bacterial Hand Lotion  Keri Deep Conditioning Original Lotion Dry Skin Formula Softly Scented  Keri Deep Conditioning Original Lotion, Fragrance Free Sensitive Skin Formula  Keri Lotion Fast Absorbing Fragrance Free Sensitive Skin Formula  Keri Lotion Fast Absorbing Softly Scented Dry Skin Formula  Keri Original Lotion  Keri Skin Renewal Lotion Keri Silky Smooth Lotion  Keri Silky Smooth Sensitive Skin Lotion  Nivea Body Creamy Conditioning Oil  Nivea Body Extra Enriched Lotion  Nivea Body Original Lotion  Nivea Body Sheer Moisturizing Lotion Nivea Crme  Nivea Skin Firming Lotion  NutraDerm 30 Skin Lotion  NutraDerm Skin Lotion  NutraDerm Therapeutic Skin Cream  NutraDerm Therapeutic Skin Lotion  ProShield Protective Hand Cream  Provon moisturizing lotion   FAILURE TO FOLLOW THESE INSTRUCTIONS MAY RESULT IN THE CANCELLATION OF YOUR SURGERY  PATIENT SIGNATURE_________________________________  NURSE SIGNATURE__________________________________  ________________________________________________________________________        Cassandra Spears    An incentive spirometer is a tool that can help keep your lungs clear and active. This tool measures how well you are filling your lungs with each breath.  Taking long deep breaths may help reverse or decrease the chance of developing breathing (pulmonary) problems (especially infection) following: A long period of time when you are unable to move or be active. BEFORE THE PROCEDURE  If the spirometer includes an indicator to show your best effort, your nurse or respiratory therapist  will set it to a desired goal. If possible, sit up straight or lean slightly forward. Try not to slouch. Hold the incentive spirometer in an upright position. INSTRUCTIONS FOR USE  Sit on the edge of your bed if possible, or sit up as far as you can in bed or on a chair. Hold the incentive spirometer in an upright position. Breathe out normally. Place the mouthpiece in your mouth and seal your lips tightly around it. Breathe in slowly and as deeply as possible, raising the piston or the ball toward the top of the column. Hold your breath for 3-5 seconds or for as long as possible. Allow the piston or ball to fall to the bottom of the column. Remove the mouthpiece from your mouth and breathe out normally. Rest for a few seconds and repeat Steps 1 through 7 at least 10 times every 1-2 hours when you are awake. Take your time and take a few normal breaths between deep breaths. The spirometer may include an indicator to show your best effort. Use the indicator as a goal to work toward during each repetition. After each set of 10 deep breaths, practice coughing to be sure your lungs are clear. If you have an incision (the cut made at the time of surgery), support your incision when coughing by placing a pillow or rolled up towels firmly against it. Once you are able to get out of bed, walk around indoors and cough well. You may stop using the incentive spirometer when instructed by your caregiver.  RISKS AND COMPLICATIONS Take your time so you do not get dizzy or light-headed. If you are in pain, you may need to take or ask for pain medication before doing incentive spirometry. It is harder to take a deep breath if you are having pain. AFTER USE Rest and breathe slowly and easily. It can be helpful to keep track of a log of your progress. Your caregiver can provide you with a simple table to help with this. If you are using the spirometer at home, follow these instructions: SEEK MEDICAL CARE IF:   You are having difficultly using the spirometer. You have trouble using the spirometer as often as instructed. Your pain medication is not giving enough relief while using the spirometer. You develop fever of 100.5 F (38.1 C) or higher.                                                                                                    SEEK IMMEDIATE MEDICAL CARE IF:  You cough up bloody sputum that had not been present before. You develop fever of 102 F (38.9 C) or greater. You develop worsening pain at or near the incision site. MAKE SURE YOU:  Understand these instructions. Will watch your  condition. Will get help right away if you are not doing well or get worse. Document Released: 06/24/2006 Document Revised: 05/06/2011 Document Reviewed: 08/25/2006 [facility] Patient Information 2014 [place], [place].          WHAT IS A BLOOD TRANSFUSION? Blood Transfusion Information  A transfusion is the replacement of blood or some of its parts. Blood is made up of multiple cells which provide different functions. Red blood cells carry oxygen and are used for blood loss replacement. White blood cells fight against infection. Platelets control bleeding. Plasma helps clot blood. Other blood products are available for specialized needs, such as hemophilia or other clotting disorders. BEFORE THE TRANSFUSION  Who gives blood for transfusions?  Healthy volunteers who are fully evaluated to make sure their blood is safe. This is blood bank blood. Transfusion therapy is the safest it has ever been in the practice of medicine. Before blood is taken from a donor, a complete history is taken to make sure that person has no history of diseases nor engages in risky social behavior (examples are intravenous drug use or sexual activity with multiple partners). The donor's travel history is screened to minimize risk of transmitting infections, such as malaria. The donated blood is tested for signs of  infectious diseases, such as HIV and hepatitis. The blood is then tested to be sure it is compatible with you in order to minimize the chance of a transfusion reaction. If you or a relative donates blood, this is often done in anticipation of surgery and is not appropriate for emergency situations. It takes many days to process the donated blood. RISKS AND COMPLICATIONS Although transfusion therapy is very safe and saves many lives, the main dangers of transfusion include:  Getting an infectious disease. Developing a transfusion reaction. This is an allergic reaction to something in the blood you were given. Every precaution is taken to prevent this. The decision to have a blood transfusion has been considered carefully by your caregiver before blood is given. Blood is not given unless the benefits outweigh the risks. AFTER THE TRANSFUSION Right after receiving a blood transfusion, you will usually feel much better and more energetic. This is especially true if your red blood cells have gotten low (anemic). The transfusion raises the level of the red blood cells which carry oxygen, and this usually causes an energy increase. The nurse administering the transfusion will monitor you carefully for complications. HOME CARE INSTRUCTIONS  No special instructions are needed after a transfusion. You may find your energy is better. Speak with your caregiver about any limitations on activity for underlying diseases you may have. SEEK MEDICAL CARE IF:  Your condition is not improving after your transfusion. You develop redness or irritation at the intravenous (IV) site. SEEK IMMEDIATE MEDICAL CARE IF:  Any of the following symptoms occur over the next 12 hours: Shaking chills. You have a temperature by mouth above 102 F (38.9 C), not controlled by medicine. Chest, back, or muscle pain. People around you feel you are not acting correctly or are confused. Shortness of breath or difficulty  breathing. Dizziness and fainting. You get a rash or develop hives. You have a decrease in urine output. Your urine turns a dark color or changes to pink, red, or brown. Any of the following symptoms occur over the next 10 days: You have a temperature by mouth above 102 F (38.9 C), not controlled by medicine. Shortness of breath. Weakness after normal activity. The white part of the eye turns  yellow (jaundice). You have a decrease in the amount of urine or are urinating less often. Your urine turns a dark color or changes to pink, red, or brown. Document Released: 02/09/2000 Document Revised: 05/06/2011 Document Reviewed: 09/28/2007 [facility] Patient Information 2014 [place], [place].  _______________________________________________________________________           If you would like to see a video about joint replacement:   [email or web address]

## 2023-11-21 ENCOUNTER — Encounter (HOSPITAL_COMMUNITY)
Admission: RE | Admit: 2023-11-21 | Discharge: 2023-11-21 | Disposition: A | Discharge status: Home / Self Care | Source: Ambulatory Visit | Attending: Orthopedic Surgery | Admitting: Orthopedic Surgery

## 2023-11-21 ENCOUNTER — Other Ambulatory Visit: Payer: Self-pay

## 2023-11-21 ENCOUNTER — Encounter (HOSPITAL_COMMUNITY): Payer: Self-pay

## 2023-11-21 VITALS — BP 151/78 | HR 77 | Temp 97.9°F | Resp 16 | Ht 61.0 in | Wt 194.0 lb

## 2023-11-21 DIAGNOSIS — M1612 Unilateral primary osteoarthritis, left hip: Secondary | ICD-10-CM | POA: Insufficient documentation

## 2023-11-21 DIAGNOSIS — Z79899 Other long term (current) drug therapy: Secondary | ICD-10-CM | POA: Insufficient documentation

## 2023-11-21 DIAGNOSIS — Z01818 Encounter for other preprocedural examination: Secondary | ICD-10-CM

## 2023-11-21 DIAGNOSIS — G4730 Sleep apnea, unspecified: Secondary | ICD-10-CM | POA: Insufficient documentation

## 2023-11-21 DIAGNOSIS — I10 Essential (primary) hypertension: Secondary | ICD-10-CM | POA: Insufficient documentation

## 2023-11-21 DIAGNOSIS — Z01812 Encounter for preprocedural laboratory examination: Secondary | ICD-10-CM | POA: Diagnosis not present

## 2023-11-21 DIAGNOSIS — R569 Unspecified convulsions: Secondary | ICD-10-CM | POA: Diagnosis not present

## 2023-11-21 DIAGNOSIS — J45909 Unspecified asthma, uncomplicated: Secondary | ICD-10-CM | POA: Diagnosis not present

## 2023-11-21 HISTORY — DX: Hypo-osmolality and hyponatremia: E87.1

## 2023-11-21 HISTORY — DX: Sleep apnea, unspecified: G47.30

## 2023-11-21 HISTORY — DX: Personal history of (healed) traumatic fracture: Z87.81

## 2023-11-21 LAB — CBC
HCT: 38 % (ref 36.0–46.0)
Hemoglobin: 12.1 g/dL (ref 12.0–15.0)
MCH: 28.1 pg (ref 26.0–34.0)
MCHC: 31.8 g/dL (ref 30.0–36.0)
MCV: 88.4 fL (ref 80.0–100.0)
Platelets: 305 K/uL (ref 150–400)
RBC: 4.3 MIL/uL (ref 3.87–5.11)
RDW: 12.8 % (ref 11.5–15.5)
WBC: 8 K/uL (ref 4.0–10.5)
nRBC: 0 % (ref 0.0–0.2)

## 2023-11-21 LAB — SURGICAL PCR SCREEN
MRSA, PCR: NEGATIVE
Staphylococcus aureus: NEGATIVE

## 2023-11-21 LAB — COMPREHENSIVE METABOLIC PANEL WITH GFR
ALT: 13 U/L (ref 0–44)
AST: 19 U/L (ref 15–41)
Albumin: 4.3 g/dL (ref 3.5–5.0)
Alkaline Phosphatase: 64 U/L (ref 38–126)
Anion gap: 12 (ref 5–15)
BUN: 8 mg/dL (ref 8–23)
CO2: 25 mmol/L (ref 22–32)
Calcium: 9.4 mg/dL (ref 8.9–10.3)
Chloride: 95 mmol/L — ABNORMAL LOW (ref 98–111)
Creatinine, Ser: 0.49 mg/dL (ref 0.44–1.00)
GFR, Estimated: >60 mL/min (ref >60)
Glucose, Bld: 79 mg/dL (ref 70–99)
Potassium: 4 mmol/L (ref 3.5–5.1)
Sodium: 132 mmol/L — ABNORMAL LOW (ref 135–145)
Total Bilirubin: 0.4 mg/dL (ref 0.0–1.2)
Total Protein: 6.6 g/dL (ref 6.5–8.1)

## 2023-11-24 NOTE — Progress Notes (Signed)
 Patient called to report that since using the CHG her eczema has really caused her issues. Instructed patient to stop using the CHG soap and switch to dial soap.

## 2023-11-24 NOTE — Progress Notes (Signed)
 1736 Able to repeat the new instructions back, to arrive at 0715.

## 2023-11-24 NOTE — H&P (Signed)
 HIP ARTHROPLASTY ADMISSION H&P  Patient ID: Cassandra Spears MRN: [id number] DOB/AGE: [birth year] 71 y.o.  Chief Complaint: left hip pain.  Planned Procedure Date: 11/25/23 Medical Clearance by Dr. Stephanie     HPI: Cassandra Spears is a 73 y.o. female who presents for evaluation of osteoarthritis of left hip. The patient has a history of pain and functional disability in the left hip due to arthritis and has failed non-surgical conservative treatments for greater than 12 weeks to include NSAID's and/or analgesics and activity modification.  Onset of symptoms was abrupt, starting 1 years ago with rapidlly worsening course since that time. The patient noted no past surgery on the left hip.  Patient currently rates pain at 10 out of 10 with activity. Patient has night pain, worsening of pain with activity and weight bearing, and pain that interferes with activities of daily living.  Patient has evidence of AVN with collapse by imaging studies.  There is no active infection.  Past Medical History:  Diagnosis Date   Allergy    Anemia    Arthritis    Asthma    Chronic hyponatremia    GERD (gastroesophageal reflux disease)    Heart murmur    History of vertebral compression fracture    Hypertension    Osteoporosis    Sleep apnea    still being evaluated for CPAP   Past Surgical History:  Procedure Laterality Date   BREAST BIOPSY Right 2002   Broken Ankle Bilateral 2014   ORIF on right ankle, casting on the left ankle   COLONOSCOPY W/ POLYPECTOMY  2018   Uterine Polyp     hysteroscopy   Allergies  Allergen Reactions   Cat Dander Other (See Comments)   Dog Epithelium (Canis Lupus Familiaris) Other (See Comments)   Molds & Smuts Other (See Comments)   Other Other (See Comments)   Prior to Admission medications   Medication Sig Start Date End Date Taking? Authorizing Provider  acetaminophen  (TYLENOL ) 500 MG tablet Take 1,000 mg by mouth 4 (four) times daily as needed (pain.).   Yes  [provider]  albuterol (PROVENTIL) (2.5 MG/3ML) 0.083% nebulizer solution Take 2.5 mg by nebulization every 6 (six) hours as needed for wheezing or shortness of breath.   Yes [provider]  albuterol (VENTOLIN HFA) 108 (90 Base) MCG/ACT inhaler Inhale 1-2 puffs into the lungs every 6 (six) hours as needed for wheezing or shortness of breath.   Yes [provider]  aspirin  EC 81 MG tablet Take 81 mg by mouth at bedtime. Swallow whole.   Yes [provider]  Azelaic Acid (FINACEA) 15 % cream Apply 1 Application topically 2 (two) times daily. After skin is thoroughly washed and patted dry, gently but thoroughly massage a thin film of azelaic acid cream into the affected area twice daily, in the morning and evening.   Yes [provider]  azelastine (ASTELIN) 0.1 % nasal spray Place 1 spray into both nostrils in the morning. Use in each nostril as directed   Yes [provider]  betamethasone dipropionate (DIPROLENE) 0.05 % cream Apply 1 Application topically daily.   Yes [provider]  BREO ELLIPTA  100-25 MCG/INH AEPB Inhale 1 puff into the lungs in the morning. 09/06/16  Yes [provider]  diclofenac Sodium (VOLTAREN) 1 % GEL Apply 1 Application topically 3 (three) times daily as needed (hip pain--pain etc).   Yes [provider]  docusate sodium  (COLACE) 100 MG capsule Take 100  mg by mouth every evening.   Yes [provider]  esomeprazole (NEXIUM) 20 MG capsule Take 20 mg by mouth every evening. 09/06/16  Yes [provider]  fexofenadine (ALLEGRA) 180 MG tablet Take 180 mg by mouth in the morning.   Yes [provider]  levETIRAcetam  (KEPPRA ) 500 MG tablet Take 1 tablet (500 mg total) by mouth 2 (two) times daily. 10/14/23  Yes Onita Duos, MD  lisinopril  (ZESTRIL ) 40 MG tablet Take 40 mg by mouth in the morning.   Yes [provider]  mometasone (NASONEX) 50 MCG/ACT nasal spray  Place 1 spray into the nose at bedtime. 09/17/16  Yes [provider]  montelukast  (SINGULAIR ) 10 MG tablet Take 10 mg by mouth at bedtime.   Yes [provider]  rosuvastatin  (CRESTOR ) 10 MG tablet Take 10 mg by mouth at bedtime.   Yes [provider]  traMADol  (ULTRAM ) 50 MG tablet Take 50 mg by mouth 3 (three) times daily as needed (pain.). 09/29/23  Yes [provider]  Vitamins A & D (VITAMIN A & D) ointment Apply 1 Application topically daily.   Yes [provider]  Ammon Hoof (PREPARATION H TOTABLES WIPES) 50 % PADS Apply 1 Application topically as needed (after bowel movements.).   Yes [provider]  ZADITOR 0.025 % ophthalmic solution Place 1 drop into both eyes 2 (two) times daily as needed (itchy eyes). 06/19/16  Yes [provider]  Zoledronic  Acid (RECLAST  IV) Inject into the vein See admin instructions. Given once a year for Osteoporosis   Yes [provider]  Ascorbic Acid (VITAMIN C) 1000 MG tablet Take 1,000 mg by mouth 2 (two) times daily.    [provider]  Calcium -Magnesium-Zinc-Vit D3 (FT CALCIUM -MAGNESIUM-ZINC-D3 PO) Take 2 tablets by mouth in the morning and at bedtime.    [provider]  diclofenac (VOLTAREN) 75 MG EC tablet Take 75 mg by mouth 2 (two) times daily. 09/10/23   [provider]  estradiol (ESTRACE) 0.1 MG/GM vaginal cream Place 1 Applicatorful vaginally 2 (two) times a week.    [provider]  KRILL OIL PO Take 1 capsule by mouth in the morning.    [provider]  Multiple Vitamins-Minerals (MULTIVITAMIN ADULTS 50+ PO) Take 1 tablet by mouth every other day. In the morning.    [provider]  Probiotic Product (CULTURELLE PROBIOTICS PO) Take 1 tablet by mouth in the morning.    [provider]  VITAMIN D  PO Take 2,000 Units by mouth daily.    [provider]   Social History   Socioeconomic History   Marital  status: Married    Spouse name: Not on file   Number of children: Not on file   Years of education: Not on file   Highest education level: Not on file  Occupational History   Not on file  Tobacco Use   Smoking status: Never   Smokeless tobacco: Never  Vaping Use   Vaping status: Never Used  Substance and Sexual Activity   Alcohol use: Yes    Comment: a few times a week wine and beer.   Drug use: No   Sexual activity: Yes  Other Topics Concern   Not on file  Social History Narrative   Not on file   Social Drivers of Health   Financial Resource Strain: Not on file  Food Insecurity: No Food Insecurity (10/02/2023)   Hunger Vital Sign    Worried About Running  Out of Food in the Last Year: Never true    Ran Out of Food in the Last Year: Never true  Transportation Needs: No Transportation Needs (10/02/2023)   PRAPARE - Administrator, Civil Service (Medical): No    Lack of Transportation (Non-Medical): No  Physical Activity: Not on file  Stress: Not on file  Social Connections: Unknown (10/02/2023)   Social Connection and Isolation Panel    Frequency of Communication with Friends and Family: More than three times a week    Frequency of Social Gatherings with Friends and Family: Three times a week    Attends Religious Services: Not on file    Active Member of Clubs or Organizations: No    Attends Banker Meetings: 1 to 4 times per year    Marital Status: Married   Family History  Problem Relation Age of Onset   Colon cancer Neg Hx    Esophageal cancer Neg Hx    Pancreatic cancer Neg Hx    Rectal cancer Neg Hx    Stomach cancer Neg Hx     ROS: Currently denies lightheadedness, dizziness, Fever, chills, CP, SOB.   No personal history of DVT, PE, MI, or CVA. No loose teeth or dentures All other systems have been reviewed and were otherwise currently negative with the exception of those mentioned in the HPI and as above.  Objective: Vitals: Height 5  feet 1 inch, weight 199 pounds, BP 126/83, pulse 101, O2 98% on room air, temperature 97.3.  Physical Exam: General: Alert, NAD.  HEENT: EOMI, Good Neck Extension  Pulm: No increased work of breathing.  Clear B/L A/P w/o crackle or wheeze.  CV: RRR, No m/g/r appreciated  GI: soft, NT, ND Neuro: Neuro without gross focal deficit.  Sensation intact distally Skin: No lesions in the area of chief complaint MSK/Surgical Site: She is able to perform about 10 degrees of forward flexion at the hip which causes significant pain.  Dorsiflexion and plantarflexion are intact at the ankle.  Sensation intact diffusely at the foot. 2+ PT pulse.  She does have some lower extremity swelling in the lower leg and foot.   Imaging Review Imaging:  MRI 08/12/2023 shows left hip AVN with collapse as do x-rays taken 09/01/2023.  Preoperative templating of the joint replacement has been completed, documented, and submitted to the Operating Room personnel in order to optimize intra-operative equipment management.  Assessment: Left hip AVN with collapse  Plan: Plan for Procedure(s): ARTHROPLASTY, HIP, TOTAL,POSTERIOR APPROACH  The patient history, physical exam, clinical judgement of the provider and imaging are consistent with end stage degenerative joint disease and total joint arthroplasty is deemed medically necessary. The treatment options including medical management, injection therapy, and arthroplasty were discussed at length. The risks and benefits of Procedure(s): ARTHROPLASTY, HIP, TOTAL,POSTERIOR APPROACH were presented and reviewed.  The risks of nonoperative treatment, versus surgical intervention including but not limited to continued pain, aseptic loosening, stiffness, dislocation/subluxation, infection, bleeding, nerve injury, blood clots, cardiopulmonary complications, morbidity, mortality, among others were discussed. The patient verbalizes understanding and wishes to proceed with the plan.   Patient is being admitted for surgery, pain control, PT, prophylactic antibiotics, VTE prophylaxis, progressive ambulation, ADL's and discharge planning.    The patient does meet the criteria for TXA which will be used perioperatively.   ASA 325 mg  will be used postoperatively for DVT prophylaxis in addition to SCDs, and early ambulation. The patient is planning to be discharged home with  OPPT in care of family   Cassandra Spears 11/24/2023 12:32 PM

## 2023-11-24 NOTE — Progress Notes (Signed)
 68 Notified Cassandra Spears about surgery time chang eo arrive at [facility] tomorrow.  To drink pre surgery drink by 0630.  Take am medications as instructed in PST visit by 0630.

## 2023-11-25 ENCOUNTER — Ambulatory Visit (HOSPITAL_COMMUNITY): Payer: Self-pay | Admitting: Physician Assistant

## 2023-11-25 ENCOUNTER — Ambulatory Visit (HOSPITAL_COMMUNITY): Admitting: Certified Registered Nurse Anesthetist

## 2023-11-25 ENCOUNTER — Observation Stay (HOSPITAL_COMMUNITY)
Admission: RE | Admit: 2023-11-25 | Discharge: 2023-11-26 | Disposition: A | Discharge status: Home / Self Care | Attending: Orthopedic Surgery | Admitting: Orthopedic Surgery

## 2023-11-25 ENCOUNTER — Observation Stay (HOSPITAL_COMMUNITY)

## 2023-11-25 ENCOUNTER — Encounter (HOSPITAL_COMMUNITY)
Admission: RE | Disposition: A | Discharge status: Home / Self Care | Payer: Self-pay | Source: Home / Self Care | Attending: Orthopedic Surgery

## 2023-11-25 ENCOUNTER — Encounter (HOSPITAL_COMMUNITY): Payer: Self-pay | Admitting: Orthopedic Surgery

## 2023-11-25 ENCOUNTER — Other Ambulatory Visit: Payer: Self-pay

## 2023-11-25 DIAGNOSIS — M87052 Idiopathic aseptic necrosis of left femur: Secondary | ICD-10-CM

## 2023-11-25 DIAGNOSIS — M1612 Unilateral primary osteoarthritis, left hip: Secondary | ICD-10-CM | POA: Diagnosis not present

## 2023-11-25 DIAGNOSIS — E871 Hypo-osmolality and hyponatremia: Secondary | ICD-10-CM | POA: Diagnosis not present

## 2023-11-25 DIAGNOSIS — Z96642 Presence of left artificial hip joint: Principal | ICD-10-CM | POA: Diagnosis present

## 2023-11-25 DIAGNOSIS — Z7982 Long term (current) use of aspirin: Secondary | ICD-10-CM | POA: Diagnosis not present

## 2023-11-25 DIAGNOSIS — I10 Essential (primary) hypertension: Secondary | ICD-10-CM

## 2023-11-25 DIAGNOSIS — Z79899 Other long term (current) drug therapy: Secondary | ICD-10-CM | POA: Insufficient documentation

## 2023-11-25 DIAGNOSIS — J45909 Unspecified asthma, uncomplicated: Secondary | ICD-10-CM | POA: Diagnosis not present

## 2023-11-25 DIAGNOSIS — M25552 Pain in left hip: Secondary | ICD-10-CM | POA: Diagnosis present

## 2023-11-25 DIAGNOSIS — R600 Localized edema: Secondary | ICD-10-CM | POA: Diagnosis not present

## 2023-11-25 HISTORY — PX: TOTAL HIP ARTHROPLASTY: SHX124

## 2023-11-25 LAB — TYPE AND SCREEN
ABO/RH(D): O POS
Antibody Screen: NEGATIVE

## 2023-11-25 LAB — ABO/RH: ABO/RH(D): O POS

## 2023-11-25 SURGERY — ARTHROPLASTY, HIP, TOTAL,POSTERIOR APPROACH
Anesthesia: Spinal | Site: Hip | Laterality: Left

## 2023-11-25 MED ORDER — KETOROLAC TROMETHAMINE 30 MG/ML IJ SOLN
INTRAMUSCULAR | Status: AC
  Filled 2023-11-25: qty 1

## 2023-11-25 MED ORDER — WITCH HAZEL-GLYCERIN EX PADS: 1.0000 | MEDICATED_PAD | CUTANEOUS | Status: DC | PRN

## 2023-11-25 MED ORDER — KETOTIFEN FUMARATE 0.035 % OP SOLN: 1.0000 [drp] | Freq: Two times a day (BID) | OPHTHALMIC | Status: DC | PRN

## 2023-11-25 MED ORDER — ALUM & MAG HYDROXIDE-SIMETH 200-200-20 MG/5ML PO SUSP: 30.0000 mL | ORAL | Status: DC | PRN

## 2023-11-25 MED ORDER — DOCUSATE SODIUM 100 MG PO CAPS
100.0000 mg | ORAL_CAPSULE | Freq: Two times a day (BID) | ORAL | Status: DC
  Administered 2023-11-25 – 2023-11-26 (×2): 100 mg via ORAL
  Filled 2023-11-25 (×2): qty 1

## 2023-11-25 MED ORDER — BUPIVACAINE IN DEXTROSE 0.75-8.25 % IT SOLN
INTRATHECAL | Status: DC | PRN
  Administered 2023-11-25: 1.8 mL via INTRATHECAL

## 2023-11-25 MED ORDER — FENTANYL CITRATE PF 50 MCG/ML IJ SOSY
25.0000 ug | PREFILLED_SYRINGE | INTRAMUSCULAR | Status: DC | PRN
  Administered 2023-11-25 (×3): 50 ug via INTRAVENOUS

## 2023-11-25 MED ORDER — CEFAZOLIN SODIUM-DEXTROSE 2-4 GM/100ML-% IV SOLN
2.0000 g | INTRAVENOUS | Status: AC
  Administered 2023-11-25: 2 g via INTRAVENOUS
  Filled 2023-11-25: qty 100

## 2023-11-25 MED ORDER — ONDANSETRON HCL 4 MG/2ML IJ SOLN: 4.0000 mg | Freq: Once | INTRAMUSCULAR | Status: DC | PRN

## 2023-11-25 MED ORDER — FENTANYL CITRATE (PF) 100 MCG/2ML IJ SOLN
INTRAMUSCULAR | Status: AC
  Filled 2023-11-25: qty 2

## 2023-11-25 MED ORDER — FLUTICASONE FUROATE-VILANTEROL 100-25 MCG/ACT IN AEPB
1.0000 | INHALATION_SPRAY | Freq: Every morning | RESPIRATORY_TRACT | Status: DC
  Administered 2023-11-26: 1 via RESPIRATORY_TRACT
  Filled 2023-11-25: qty 28

## 2023-11-25 MED ORDER — PANTOPRAZOLE SODIUM 40 MG PO TBEC
40.0000 mg | DELAYED_RELEASE_TABLET | Freq: Every day | ORAL | Status: DC
Start: 2023-11-25 — End: 2023-11-26
  Administered 2023-11-25 – 2023-11-26 (×2): 40 mg via ORAL
  Filled 2023-11-25 (×2): qty 1

## 2023-11-25 MED ORDER — BISACODYL 10 MG RE SUPP: 10.0000 mg | Freq: Every day | RECTAL | Status: DC | PRN

## 2023-11-25 MED ORDER — ACETAMINOPHEN 10 MG/ML IV SOLN: 1000.0000 mg | Freq: Once | INTRAVENOUS | Status: DC | PRN

## 2023-11-25 MED ORDER — PHENYLEPHRINE HCL-NACL 20-0.9 MG/250ML-% IV SOLN
INTRAVENOUS | Status: DC | PRN
  Administered 2023-11-25: 30 ug/min via INTRAVENOUS

## 2023-11-25 MED ORDER — DEXAMETHASONE SODIUM PHOSPHATE 10 MG/ML IJ SOLN
INTRAMUSCULAR | Status: AC
  Filled 2023-11-25: qty 1

## 2023-11-25 MED ORDER — OXYCODONE HCL 5 MG PO TABS
10.0000 mg | ORAL_TABLET | ORAL | Status: DC | PRN
  Administered 2023-11-25: 10 mg via ORAL
  Filled 2023-11-25 (×2): qty 2

## 2023-11-25 MED ORDER — ASPIRIN 325 MG PO TBEC
325.0000 mg | DELAYED_RELEASE_TABLET | Freq: Two times a day (BID) | ORAL | Status: DC
  Administered 2023-11-26: 325 mg via ORAL
  Filled 2023-11-25: qty 1

## 2023-11-25 MED ORDER — DEXAMETHASONE SODIUM PHOSPHATE 10 MG/ML IJ SOLN
10.0000 mg | Freq: Once | INTRAMUSCULAR | Status: AC
  Administered 2023-11-26: 10 mg via INTRAVENOUS
  Filled 2023-11-25: qty 1

## 2023-11-25 MED ORDER — METOCLOPRAMIDE HCL 5 MG/ML IJ SOLN: 5.0000 mg | Freq: Three times a day (TID) | INTRAMUSCULAR | Status: DC | PRN

## 2023-11-25 MED ORDER — STERILE WATER FOR IRRIGATION IR SOLN
Status: DC | PRN
  Administered 2023-11-25: 2000 mL

## 2023-11-25 MED ORDER — ONDANSETRON HCL 4 MG/2ML IJ SOLN
INTRAMUSCULAR | Status: DC | PRN
  Administered 2023-11-25: 4 mg via INTRAVENOUS

## 2023-11-25 MED ORDER — TRANEXAMIC ACID-NACL 1000-0.7 MG/100ML-% IV SOLN
1000.0000 mg | Freq: Once | INTRAVENOUS | Status: AC
  Administered 2023-11-25: 1000 mg via INTRAVENOUS
  Filled 2023-11-25: qty 100

## 2023-11-25 MED ORDER — ACETAMINOPHEN 500 MG PO TABS
1000.0000 mg | ORAL_TABLET | Freq: Once | ORAL | Status: DC
Start: 2023-11-25 — End: 2023-11-25
  Filled 2023-11-25: qty 2

## 2023-11-25 MED ORDER — ALBUTEROL SULFATE HFA 108 (90 BASE) MCG/ACT IN AERS: 1.0000 | INHALATION_SPRAY | Freq: Four times a day (QID) | RESPIRATORY_TRACT | Status: DC | PRN

## 2023-11-25 MED ORDER — MENTHOL 3 MG MT LOZG: 1.0000 | LOZENGE | OROMUCOSAL | Status: DC | PRN

## 2023-11-25 MED ORDER — LACTATED RINGERS IV SOLN: INTRAVENOUS | Status: DC

## 2023-11-25 MED ORDER — 0.9 % SODIUM CHLORIDE (POUR BTL) OPTIME
TOPICAL | Status: DC | PRN
  Administered 2023-11-25: 1000 mL

## 2023-11-25 MED ORDER — DEXAMETHASONE SODIUM PHOSPHATE 10 MG/ML IJ SOLN
INTRAMUSCULAR | Status: DC | PRN
  Administered 2023-11-25: 5 mg via INTRAVENOUS

## 2023-11-25 MED ORDER — OXYCODONE HCL 5 MG/5ML PO SOLN: 5.0000 mg | Freq: Once | ORAL | Status: DC | PRN

## 2023-11-25 MED ORDER — OXYCODONE HCL 5 MG PO TABS: 5.0000 mg | ORAL_TABLET | Freq: Once | ORAL | Status: DC | PRN

## 2023-11-25 MED ORDER — HYDROMORPHONE HCL 1 MG/ML IJ SOLN
0.5000 mg | INTRAMUSCULAR | Status: DC | PRN
  Administered 2023-11-25: 0.5 mg via INTRAVENOUS
  Filled 2023-11-25 (×2): qty 1

## 2023-11-25 MED ORDER — BUPIVACAINE HCL (PF) 0.25 % IJ SOLN: INTRAMUSCULAR | Status: DC | PRN

## 2023-11-25 MED ORDER — PHENOL 1.4 % MT LIQD: 1.0000 | OROMUCOSAL | Status: DC | PRN

## 2023-11-25 MED ORDER — LEVETIRACETAM 500 MG PO TABS
500.0000 mg | ORAL_TABLET | Freq: Two times a day (BID) | ORAL | Status: DC
  Administered 2023-11-25 – 2023-11-26 (×2): 500 mg via ORAL
  Filled 2023-11-25 (×2): qty 1

## 2023-11-25 MED ORDER — METHOCARBAMOL 1000 MG/10ML IJ SOLN: 500.0000 mg | Freq: Four times a day (QID) | INTRAMUSCULAR | Status: DC | PRN

## 2023-11-25 MED ORDER — PROPOFOL 500 MG/50ML IV EMUL
INTRAVENOUS | Status: DC | PRN
  Administered 2023-11-25: 75 ug/kg/min via INTRAVENOUS

## 2023-11-25 MED ORDER — METHOCARBAMOL 500 MG PO TABS
500.0000 mg | ORAL_TABLET | Freq: Four times a day (QID) | ORAL | Status: DC | PRN
  Administered 2023-11-25 – 2023-11-26 (×3): 500 mg via ORAL
  Filled 2023-11-25 (×2): qty 1

## 2023-11-25 MED ORDER — TRANEXAMIC ACID-NACL 1000-0.7 MG/100ML-% IV SOLN
1000.0000 mg | INTRAVENOUS | Status: AC
  Administered 2023-11-25: 1000 mg via INTRAVENOUS
  Filled 2023-11-25: qty 100

## 2023-11-25 MED ORDER — POTASSIUM CHLORIDE IN NACL 20-0.9 MEQ/L-% IV SOLN
INTRAVENOUS | Status: DC
  Filled 2023-11-25: qty 1000

## 2023-11-25 MED ORDER — ORAL CARE MOUTH RINSE: 15.0000 mL | Freq: Once | OROMUCOSAL | Status: AC

## 2023-11-25 MED ORDER — ALBUTEROL SULFATE (2.5 MG/3ML) 0.083% IN NEBU: 2.5000 mg | INHALATION_SOLUTION | Freq: Four times a day (QID) | RESPIRATORY_TRACT | Status: DC | PRN

## 2023-11-25 MED ORDER — FENTANYL CITRATE PF 50 MCG/ML IJ SOSY
PREFILLED_SYRINGE | INTRAMUSCULAR | Status: AC
  Filled 2023-11-25: qty 3

## 2023-11-25 MED ORDER — ONDANSETRON HCL 4 MG PO TABS: 4.0000 mg | ORAL_TABLET | Freq: Four times a day (QID) | ORAL | Status: DC | PRN

## 2023-11-25 MED ORDER — MAGNESIUM CITRATE PO SOLN: 1.0000 | Freq: Once | ORAL | Status: DC | PRN

## 2023-11-25 MED ORDER — ROSUVASTATIN CALCIUM 10 MG PO TABS
10.0000 mg | ORAL_TABLET | Freq: Every day | ORAL | Status: DC
  Administered 2023-11-25: 10 mg via ORAL
  Filled 2023-11-25: qty 1

## 2023-11-25 MED ORDER — ACETAMINOPHEN 325 MG PO TABS
325.0000 mg | ORAL_TABLET | Freq: Four times a day (QID) | ORAL | Status: DC | PRN
  Filled 2023-11-25: qty 2

## 2023-11-25 MED ORDER — ACETAMINOPHEN 500 MG PO TABS
1000.0000 mg | ORAL_TABLET | Freq: Four times a day (QID) | ORAL | Status: AC
  Administered 2023-11-25 – 2023-11-26 (×4): 1000 mg via ORAL
  Filled 2023-11-25 (×4): qty 2

## 2023-11-25 MED ORDER — BUPIVACAINE HCL (PF) 0.25 % IJ SOLN
INTRAMUSCULAR | Status: AC
  Filled 2023-11-25: qty 30

## 2023-11-25 MED ORDER — METOCLOPRAMIDE HCL 5 MG PO TABS: 5.0000 mg | ORAL_TABLET | Freq: Three times a day (TID) | ORAL | Status: DC | PRN

## 2023-11-25 MED ORDER — POVIDONE-IODINE 10 % EX SWAB: 2.0000 | Freq: Once | CUTANEOUS | Status: DC

## 2023-11-25 MED ORDER — POLYETHYLENE GLYCOL 3350 17 G PO PACK: 17.0000 g | PACK | Freq: Every day | ORAL | Status: DC | PRN

## 2023-11-25 MED ORDER — MONTELUKAST SODIUM 10 MG PO TABS
10.0000 mg | ORAL_TABLET | Freq: Every day | ORAL | Status: DC
  Administered 2023-11-25: 10 mg via ORAL
  Filled 2023-11-25: qty 1

## 2023-11-25 MED ORDER — ONDANSETRON HCL 4 MG/2ML IJ SOLN: 4.0000 mg | Freq: Four times a day (QID) | INTRAMUSCULAR | Status: DC | PRN

## 2023-11-25 MED ORDER — METHOCARBAMOL 500 MG PO TABS
ORAL_TABLET | ORAL | Status: AC
  Filled 2023-11-25: qty 1

## 2023-11-25 MED ORDER — CHLORHEXIDINE GLUCONATE 0.12 % MT SOLN
15.0000 mL | Freq: Once | OROMUCOSAL | Status: AC
  Administered 2023-11-25: 15 mL via OROMUCOSAL

## 2023-11-25 MED ORDER — OXYCODONE HCL 5 MG PO TABS
5.0000 mg | ORAL_TABLET | ORAL | Status: DC | PRN
  Administered 2023-11-25: 10 mg via ORAL
  Administered 2023-11-26: 5 mg via ORAL
  Administered 2023-11-26 (×2): 10 mg via ORAL
  Filled 2023-11-25 (×3): qty 2

## 2023-11-25 MED ORDER — ONDANSETRON HCL 4 MG/2ML IJ SOLN
INTRAMUSCULAR | Status: AC
Start: 2023-11-25 — End: 2023-11-25
  Filled 2023-11-25: qty 2

## 2023-11-25 MED ORDER — KETOROLAC TROMETHAMINE 30 MG/ML IJ SOLN
INTRAMUSCULAR | Status: DC | PRN
  Administered 2023-11-25: 31 mL

## 2023-11-25 MED ORDER — FENTANYL CITRATE (PF) 100 MCG/2ML IJ SOLN
INTRAMUSCULAR | Status: DC | PRN
  Administered 2023-11-25: 50 ug via INTRAVENOUS
  Administered 2023-11-25: 25 ug via INTRAVENOUS

## 2023-11-25 MED ORDER — LISINOPRIL 20 MG PO TABS
40.0000 mg | ORAL_TABLET | Freq: Every day | ORAL | Status: DC
  Administered 2023-11-26: 40 mg via ORAL
  Filled 2023-11-25: qty 2

## 2023-11-25 MED ORDER — PROPOFOL 10 MG/ML IV BOLUS
INTRAVENOUS | Status: DC | PRN
  Administered 2023-11-25: 30 mg via INTRAVENOUS

## 2023-11-25 MED ORDER — DIPHENHYDRAMINE HCL 12.5 MG/5ML PO ELIX: 12.5000 mg | ORAL_SOLUTION | ORAL | Status: DC | PRN

## 2023-11-25 MED ORDER — PROPOFOL 1000 MG/100ML IV EMUL
INTRAVENOUS | Status: AC
Start: 2023-11-25 — End: 2023-11-25
  Filled 2023-11-25: qty 100

## 2023-11-25 SURGICAL SUPPLY — 52 items
ANCHOR SUT KEITH ABD SZ2 STR (SUTURE) ×2 IMPLANT
BAG COUNTER SPONGE SURGICOUNT (BAG) IMPLANT
BAG ZIPLOCK 12X15 (MISCELLANEOUS) IMPLANT
BALL HIP CERAMIC (Hips) IMPLANT
BIT DRILL 2.0X128 (BIT) ×2 IMPLANT
BLADE SAW SGTL 73X25 THK (BLADE) ×2 IMPLANT
CLSR STERI-STRIP ANTIMIC 1/2X4 (GAUZE/BANDAGES/DRESSINGS) ×4 IMPLANT
COVER SURGICAL LIGHT HANDLE (MISCELLANEOUS) ×2 IMPLANT
CUP SECTOR GRIPTON 50MM (Cup) IMPLANT
DRAPE INCISE IOBAN 66X45 STRL (DRAPES) ×2 IMPLANT
DRAPE POUCH INSTRU U-SHP 10X18 (DRAPES) ×2 IMPLANT
DRAPE SHEET LG 3/4 BI-LAMINATE (DRAPES) ×2 IMPLANT
DRAPE SURG 17X11 SM STRL (DRAPES) ×2 IMPLANT
DRAPE SURG ORHT 6 SPLT 77X108 (DRAPES) ×4 IMPLANT
DRAPE U-SHAPE 47X51 STRL (DRAPES) ×2 IMPLANT
DRSG MEPILEX POST OP 4X12 (GAUZE/BANDAGES/DRESSINGS) ×2 IMPLANT
DURAPREP 26ML APPLICATOR (WOUND CARE) ×4 IMPLANT
ELECT BLADE TIP CTD 4 INCH (ELECTRODE) ×2 IMPLANT
ELECT PENCIL ROCKER SW 15FT (MISCELLANEOUS) ×2 IMPLANT
ELECT REM PT RETURN 15FT ADLT (MISCELLANEOUS) ×2 IMPLANT
ELIMINATOR HOLE APEX DEPUY (Hips) IMPLANT
FACESHIELD WRAPAROUND OR TEAM (MASK) ×2 IMPLANT
GLOVE BIO SURGEON STRL SZ 6.5 (GLOVE) ×2 IMPLANT
GLOVE BIO SURGEON STRL SZ7.5 (GLOVE) ×2 IMPLANT
GLOVE BIOGEL PI IND STRL 7.0 (GLOVE) ×2 IMPLANT
GLOVE BIOGEL PI IND STRL 8 (GLOVE) ×2 IMPLANT
GOWN STRL SURGICAL XL XLNG (GOWN DISPOSABLE) ×4 IMPLANT
HOOD PEEL AWAY T7 (MISCELLANEOUS) ×6 IMPLANT
KIT BASIN OR (CUSTOM PROCEDURE TRAY) ×2 IMPLANT
KIT TURNOVER KIT A (KITS) ×2 IMPLANT
LINER ACET PNNCL PLUS4 NEUTRAL (Hips) IMPLANT
MANIFOLD NEPTUNE II (INSTRUMENTS) ×2 IMPLANT
NDL SAFETY ECLIPSE 18X1.5 (NEEDLE) ×4 IMPLANT
NS IRRIG 1000ML POUR BTL (IV SOLUTION) ×2 IMPLANT
PACK TOTAL JOINT (CUSTOM PROCEDURE TRAY) ×2 IMPLANT
PROTECTOR NERVE ULNAR (MISCELLANEOUS) ×2 IMPLANT
SCREW 6.5MMX25MM (Screw) IMPLANT
SCREW PINN CAN 6.5X20 (Screw) IMPLANT
STEM FEM ACTIS STD SZ7 (Nail) IMPLANT
SUCTION TUBE FRAZIER 12FR DISP (SUCTIONS) ×2 IMPLANT
SUT ETHIBOND NAB CT1 #1 30IN (SUTURE) ×6 IMPLANT
SUT STRATAFIX 14 PDO 48 VLT (SUTURE) ×2 IMPLANT
SUT VIC AB 1 CT1 36 (SUTURE) ×4 IMPLANT
SUT VIC AB 2-0 CT1 TAPERPNT 27 (SUTURE) ×4 IMPLANT
SUT VIC AB 3-0 SH 27X BRD (SUTURE) ×4 IMPLANT
SYR 30ML LL (SYRINGE) ×2 IMPLANT
SYR 3ML LL SCALE MARK (SYRINGE) ×2 IMPLANT
TOWEL GREEN STERILE FF (TOWEL DISPOSABLE) ×2 IMPLANT
TOWEL OR 17X26 10 PK STRL BLUE (TOWEL DISPOSABLE) ×2 IMPLANT
TRAY FOLEY MTR SLVR 16FR STAT (SET/KITS/TRAYS/PACK) ×2 IMPLANT
TUBE SUCTION HIGH CAP CLEAR NV (SUCTIONS) ×2 IMPLANT
WATER STERILE IRR 1000ML POUR (IV SOLUTION) ×4 IMPLANT

## 2023-11-25 NOTE — Anesthesia Procedure Notes (Signed)
 Spinal  Patient location during procedure: OR Start time: 11/25/2023 9:45 AM End time: 11/25/2023 9:50 AM Reason for block: surgical anesthesia Staffing Performed: anesthesiologist  Anesthesiologist: Keneth Lynwood POUR, MD Performed by: Keneth Lynwood POUR, MD Authorized by: Keneth Lynwood POUR, MD   Preanesthetic Checklist Completed: patient identified, IV checked, site marked, risks and benefits discussed, surgical consent, monitors and equipment checked, pre-op evaluation and timeout performed Spinal Block Patient position: sitting Prep: DuraPrep Patient monitoring: heart rate, cardiac monitor, continuous pulse ox and blood pressure Approach: midline Location: L3-4 Injection technique: single-shot Needle Needle type: Sprotte  Needle gauge: 24 G Needle length: 9 cm Assessment Sensory level: T4 Events: CSF return

## 2023-11-25 NOTE — Anesthesia Postprocedure Evaluation (Signed)
 Anesthesia Post Note  Patient: Cassandra Spears  Procedure(s) Performed: ARTHROPLASTY, HIP, TOTAL,POSTERIOR APPROACH (Left: Hip)     Patient location during evaluation: PACU Anesthesia Type: Spinal Level of consciousness: awake and alert Pain management: pain level controlled Vital Signs Assessment: post-procedure vital signs reviewed and stable Respiratory status: spontaneous breathing, nonlabored ventilation, respiratory function stable and patient connected to nasal cannula oxygen Cardiovascular status: blood pressure returned to baseline and stable Postop Assessment: no apparent nausea or vomiting Anesthetic complications: no   No notable events documented.  Last Vitals:  Vitals:   11/25/23 1400 11/25/23 1415  BP: (!) 141/67 121/78  Pulse: 70 69  Resp: 13 13  Temp:  (!) 36.3 C  SpO2: 98% 97%    Last Pain:  Vitals:   11/25/23 1400  TempSrc:   PainSc: 3                  Lynwood MARLA Cornea

## 2023-11-25 NOTE — Op Note (Signed)
 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 female who has a diagnosis of left hip AVN with collapse and elected for surgical management after failing conservative treatment.  The risks benefits and alternatives were discussed with the patient including but not limited to the risks of nonoperative treatment, versus surgical intervention including infection, bleeding, nerve injury, periprosthetic fracture, the need for revision surgery, dislocation, leg length discrepancy, blood clots, cardiopulmonary complications, morbidity, mortality, among others, and they were willing to proceed.  She had been wheelchair-bound for at least 6 months, unable to bear weight on the left hip.   OPERATIVE REPORT     SURGEON:  Fonda Olmsted, MD    ASSISTANT:  Army Daring, PA-C, (Present throughout the entire procedure,  necessary for completion of procedure in a timely manner, assisting with retraction, instrumentation, and closure)     ANESTHESIA: Spinal  ESTIMATED BLOOD LOSS: 750 mL    COMPLICATIONS:  None.     UNIQUE ASPECTS OF THE CASE: The femoral head was completely collapsed, and fragmented, and there were cartilaginous fragments floating throughout the acetabulum attached to soft tissue.  She had a fairly sizable soft tissue envelope during the approach, which was not easy to gain access.  Identifying the capsule was extremely difficult.  I worked aggressively to keep the acetabulum low, although there was superior wear.  I had about 1.5 nozzle breaths of bone showing on the anterior wall, and I reamed line to line to a size 50.  Initially it was difficult to get the acetabulum to stick, the bone quality was fairly poor.  I did place 2  screws, the posterior most one was a 20 mm, both screws had mediocre bite at best.  I was surprised at how large the femur was, I ended up placing a size 7 potted at least 1 tooth proud to restore length, and I used a 5 neck length head.  She was extremely short preoperatively, and I was able to get excellent soft tissue closure, it was hard to assess her leg lengths intraoperatively because of the soft tissue envelope, but I was ultimately very satisfied with stability.  COMPONENTS:  Implant Name: ARMAN DAKIN APEX DEPUY - [id number] Type: Hips Inv. Item: ELIMINATOR HOLE APEX DEPUY Serial No.:  Manufacturer: [employer] Lot No.: [id number] LRB: Left No. Used: 1 Action: Implanted   Implant Name: CUP SECTOR GRIPTON - [id number] Type: Cup Inv. Item: CUP SECTOR GRIPTON Serial No.:  Manufacturer: [employer] Lot No.: [id number] LRB: Left No. Used: 1 Action: Implanted   Implant Name: SCREW 6.5MMX25MM - [id number] Type: Screw Inv. Item: SCREW 6.5MMX25MM Serial No.:  Manufacturer: [employer] Lot No.: [id number] LRB: Left No. Used: 1 Action: Implanted   Implant Name: SCREW PINN CAN 6.5X20 - [id number] Type: Screw Inv. Item: SCREW PINN CAN 6.5X20 Serial No.:  Manufacturer: [employer] Lot No.: [id number] LRB: Left No. Used: 1 Action: Implanted   Implant Name: STEM FEM ACTIS STD SZ7 - [id number] Type: Nail Inv. Item: STEM FEM ACTIS STD SZ7 Serial No.:  Manufacturer: [employer] Lot No.: [id number] LRB: Left No. Used: 1 Action: Implanted   Implant Name: LINER ACET PNNCL PLUS4 NEUTRAL - [id number] Type: Hips Inv. Item: LINER ACET PNNCL PLUS4 NEUTRAL Serial No.:  Manufacturer: [employer] Lot No.: [id number] LRB: Left No. Used: 1 Action: Implanted   Implant Name: BALL HIP CERAMIC - [id number] Type: Hips Inv. Item: BALL HIP CERAMIC Serial No.:  Manufacturer: [employer] Lot No.: [id number] LRB: Left No. Used:  1 Action: Implanted     PROCEDURE IN DETAIL:   The patient was met in the holding area and  identified.  The appropriate hip was identified and marked at the operative site.  The patient was then transported to the OR  and  placed under anesthesia.  At that point, the patient was  placed in the lateral decubitus position with the operative side up and  secured to the operating room table and all bony prominences padded.     The operative lower extremity was prepped from the iliac crest to the distal leg.  Sterile draping was performed.  Time out was performed prior to incision.      A routine posterolateral approach was utilized via sharp dissection  carried down through the subcutaneous tissue.  Gross bleeders were Bovie coagulated.  The iliotibial band was identified and incised along the length of the skin incision.  Self-retaining retractors were  inserted.  With the hip internally rotated, the short external rotators  were identified. The piriformis and capsule was tagged with Ethibond, and the hip capsule released in a T-type fashion.  The femoral neck was exposed, and I resected the femoral neck using the appropriate jig. This was performed at approximately a thumb's breadth above the lesser trochanter.    I then exposed the deep acetabulum, cleared out any tissue including the ligamentum teres.  A wing retractor was placed.  After adequate visualization, I excised the labrum, and then sequentially reamed.  I placed the trial acetabulum, which seated nicely, and then impacted the real cup into place.  Appropriate version and inclination was confirmed clinically matching their bony anatomy, and also with the use of the jig.  I placed a cancellous screw to augment fixation.  I used a second screw because the overall bone quality was poor.  There was not much room to medialize, I was fully flush with the medial wall.  A trial polyethylene liner was placed and the wing retractor removed.    I  then prepared the proximal femur using the cookie-cutter, the lateralizing reamer, and then sequentially reamed and broached.  A trial broach, neck, and head was utilized, and I reduced the hip and it was found to have excellent stability with functional range of motion. The trial components were then removed, and the real polyethylene liner was placed.  I then impacted the real femoral prosthesis into place into the appropriate version, slightly anteverted to the normal anatomy, and I impacted the real head ball into place. The hip was then reduced and taken through functional range of motion and found to have excellent stability. Leg lengths were restored.  I then used a 2 mm drill bits to pass the Ethibond suture from the capsule and piriformis through the greater trochanter, and secured this. Excellent posterior capsular repair was achieved. I also closed the T in the capsule.  I then irrigated the hip copiously again, and repaired the fascia with Stratafix, followed by Vicryl for the subcutaneous tissue, Monocryl for the skin, Steri-Strips and sterile gauze. The wounds were injected. The patient was then awakened and returned to PACU in stable and satisfactory condition. There were no complications.  Fonda Olmsted, MD Orthopedic Surgeon [phone]   11/25/2023 12:40 PM

## 2023-11-25 NOTE — Anesthesia Procedure Notes (Signed)
 Procedure Name: MAC Date/Time: 11/25/2023 9:44 AM  Performed by: Judythe Tanda Aran, CRNAPre-anesthesia Checklist: Patient identified, Emergency Drugs available, Suction available and Patient being monitored Patient Re-evaluated:Patient Re-evaluated prior to induction Oxygen Delivery Method: Simple face mask

## 2023-11-25 NOTE — Interval H&P Note (Signed)
 History and Physical Interval Note:  11/25/2023 7:20 AM  Cassandra Spears  has presented today for surgery, with the diagnosis of osteoarthritis of left hip.  The various methods of treatment have been discussed with the patient and family. After consideration of risks, benefits and other options for treatment, the patient has consented to  Procedure(s): ARTHROPLASTY, HIP, TOTAL,POSTERIOR APPROACH (Left) as a surgical intervention.  The patient's history has been reviewed, patient examined, no change in status, stable for surgery.  I have reviewed the patient's chart and labs.  Questions were answered to the patient's satisfaction.     Fonda SHAUNNA Olmsted

## 2023-11-25 NOTE — Evaluation (Signed)
 Physical Therapy Evaluation Patient Details Name: Cassandra Spears MRN: [id number] DOB: 04/15/1950 Today's Date: 11/25/2023  History of Present Illness  73 yo female presents to therapy s/p L THA, posterior approach on 11/25/2023 due to avascular necrosis with collapse and  failure of conservative measures. Pt is currently L LE WBAT and no formal posterior hip precautions. Pt PMH includes but is not limited to: anemia, arthritis, asthma, GERD, heart murmur, vertebral compression, 09/2023 neurological event with AMS, HTN, OSA, B ankle fx s/p R ORIF and L cast.  Clinical Impression     Cassandra Spears is a 73 y.o. female POD 0 s/p L THA, posterior approach. Patient reports mod I for functional mobility with some assist for IADLs at baseline. Patient is now limited by functional impairments (see PT problem list below) and requires min A for bed mobility and min A for transfers. Patient was able to ambulate 40 feet with RW and CGA level of assist. Patient instructed in exercise to facilitate ROM and circulation to manage edema. Patient will benefit from continued skilled PT interventions to address impairments and progress towards PLOF. Acute PT will follow to progress mobility and stair training in preparation for safe discharge home with family support and OPPT services scheduled for 10/6.       If plan is discharge home, recommend the following: A little help with walking and/or transfers;A little help with bathing/dressing/bathroom;Assistance with cooking/housework;Assist for transportation   Can travel by private vehicle        Equipment Recommendations None recommended by PT  Recommendations for Other Services       Functional Status Assessment Patient has had a recent decline in their functional status and demonstrates the ability to make significant improvements in function in a reasonable and predictable amount of time.     Precautions / Restrictions Precautions Precautions:  Fall Restrictions Weight Bearing Restrictions Per Provider Order: No      Mobility  Bed Mobility Overal bed mobility: Needs Assistance Bed Mobility: Supine to Sit     Supine to sit: Min assist, HOB elevated, Used rails     General bed mobility comments: cues and increased time with PT min A for LLE to EOB and  supporting L LE to floor    Transfers Overall transfer level: Needs assistance Equipment used: Rolling walker (2 wheels) Transfers: Sit to/from Stand Sit to Stand: Min assist           General transfer comment: min cues    Ambulation/Gait Ambulation/Gait assistance: Contact guard assist Gait Distance (Feet): 40 Feet Assistive device: Rolling walker (2 wheels) Gait Pattern/deviations: Step-to pattern, Antalgic, Trunk flexed, Decreased stance time - left Gait velocity: decreased     General Gait Details: B UE support at RW with slight trunk flexion and pt offloading L LE in stance phase min cues for safety, posture and L LE placement  Stairs            Wheelchair Mobility     Tilt Bed    Modified Rankin (Stroke Patients Only)       Balance Overall balance assessment: Needs assistance Sitting-balance support: Feet supported Sitting balance-Leahy Scale: Good     Standing balance support: Bilateral upper extremity supported, During functional activity, Reliant on assistive device for balance Standing balance-Leahy Scale: Poor                               Pertinent Vitals/Pain Pain Assessment  Pain Assessment: 0-10 Pain Score: 4  Pain Location: L hip and LE Pain Descriptors / Indicators: Aching, Constant, Discomfort, Operative site guarding, Grimacing Pain Intervention(s): Limited activity within patient's tolerance, Monitored during session, Premedicated before session, Repositioned, Ice applied    Home Living Family/patient expects to be discharged to:: Private residence Living Arrangements: Spouse/significant  other Available Help at Discharge: Family Type of Home: House Home Access: Ramped entrance       Home Layout: Other (Comment);One level (1 step to access living room and one handrail and uses SPC with handrail at baseline to navigate step) Home Equipment: Cane - single point;Shower seat;Standard Building surveyor (2 wheels);Wheelchair - manual Additional Comments: Former Museum/gallery conservator    Prior Function Prior Level of Function : Needs assist       Physical Assist : Mobility (physical)     Mobility Comments: RW for amb in home and use of wc for community distance ADLs Comments: mod I for BADLs and self care tasks spouse assist with IADLs     Extremity/Trunk Assessment        Lower Extremity Assessment Lower Extremity Assessment: LLE deficits/detail LLE Deficits / Details: ankle DF/PF 5/5 LLE Sensation: decreased proprioception (mild once in L LE WB)    Cervical / Trunk Assessment Cervical / Trunk Assessment: Normal  Communication   Communication Communication: No apparent difficulties    Cognition Arousal: Alert Behavior During Therapy: WFL for tasks assessed/performed   PT - Cognitive impairments: No apparent impairments                         Following commands: Intact       Cueing       General Comments      Exercises Total Joint Exercises Ankle Circles/Pumps: AROM, Both, 10 reps   Assessment/Plan    PT Assessment Patient needs continued PT services  PT Problem List Decreased strength;Decreased range of motion;Decreased activity tolerance;Decreased balance;Decreased mobility;Decreased coordination;Pain       PT Treatment Interventions DME instruction;Gait training;Stair training;Functional mobility training;Therapeutic activities;Therapeutic exercise;Balance training;Neuromuscular re-education;Patient/family education;Modalities    PT Goals (Current goals can be found in the Care Plan section)   Acute Rehab PT Goals Patient Stated Goal: to be able to travel and see the grandchildren, walk no pain or AD PT Goal Formulation: With patient Time For Goal Achievement: 12/09/23 Potential to Achieve Goals: Good    Frequency 7X/week     Co-evaluation               AM-PAC PT 6 Clicks Mobility  Outcome Measure Help needed turning from your back to your side while in a flat bed without using bedrails?: A Little Help needed moving from lying on your back to sitting on the side of a flat bed without using bedrails?: A Little Help needed moving to and from a bed to a chair (including a wheelchair)?: A Little Help needed standing up from a chair using your arms (e.g., wheelchair or bedside chair)?: A Little Help needed to walk in hospital room?: A Little Help needed climbing 3-5 steps with a railing? : A Lot 6 Click Score: 17    End of Session Equipment Utilized During Treatment: Gait belt Activity Tolerance: Patient tolerated treatment well;No increased pain Patient left: in chair;with call bell/phone within reach;with family/visitor present;with nursing/sitter in room Nurse Communication: Mobility status PT Visit Diagnosis: Unsteadiness on feet (R26.81);Other abnormalities of gait and mobility (R26.89);Muscle weakness (generalized) (M62.81);Difficulty in  walking, not elsewhere classified (R26.2);Pain Pain - Right/Left: Left Pain - part of body: Leg;Hip    Time: 8392-8368 PT Time Calculation (min) (ACUTE ONLY): 24 min   Charges:   PT Evaluation $PT Eval Low Complexity: 1 Low PT Treatments $Gait Training: 8-22 mins PT General Charges $$ ACUTE PT VISIT: 1 Visit         [place], PT Acute Rehab   [place] VEAR Drone 11/25/2023, 5:22 PM

## 2023-11-25 NOTE — Discharge Instructions (Signed)

## 2023-11-25 NOTE — Transfer of Care (Signed)
 Immediate Anesthesia Transfer of Care Note  Patient: Cassandra Spears  Procedure(s) Performed: ARTHROPLASTY, HIP, TOTAL,POSTERIOR APPROACH (Left: Hip)  Patient Location: PACU  Anesthesia Type:Spinal  Level of Consciousness: awake and patient cooperative  Airway & Oxygen Therapy: Patient Spontanous Breathing and Patient connected to face mask  Post-op Assessment: Report given to RN and Post -op Vital signs reviewed and stable  Post vital signs: Reviewed and stable  Last Vitals:  Vitals Value Taken Time  BP    Temp    Pulse    Resp    SpO2      Last Pain:  Vitals:   11/25/23 0738  TempSrc: Oral  PainSc: 0-No pain         Complications: No notable events documented.

## 2023-11-25 NOTE — Plan of Care (Signed)

## 2023-11-25 NOTE — Anesthesia Preprocedure Evaluation (Addendum)
 Anesthesia Evaluation  Patient identified by MRN, date of birth, ID band Patient awake    Reviewed: Allergy & Precautions, NPO status , Patient's Chart, lab work & pertinent test results, reviewed documented beta blocker date and time   History of Anesthesia Complications Negative for: history of anesthetic complications  Airway Mallampati: II  TM Distance: >3 FB     Dental no notable dental hx.    Pulmonary asthma , sleep apnea and Continuous Positive Airway Pressure Ventilation    breath sounds clear to auscultation       Cardiovascular hypertension, (-) angina (-) CAD, (-) Past MI and (-) Cardiac Stents + Valvular Problems/Murmurs  Rhythm:Regular Rate:Normal  IMPRESSIONS     1. Left ventricular ejection fraction, by estimation, is 60 to 65%. The  left ventricle has normal function. The left ventricle has no regional  wall motion abnormalities. There is mild left ventricular hypertrophy of  the basal-septal segment. Left  ventricular diastolic parameters are consistent with Grade I diastolic  dysfunction (impaired relaxation).   2. Right ventricular systolic function is normal. The right ventricular  size is normal.   3. The mitral valve is normal in structure. Trivial mitral valve  regurgitation. No evidence of mitral stenosis.   4. The aortic valve is normal in structure. Aortic valve regurgitation is  not visualized. No aortic stenosis is present.   5. The inferior vena cava is normal in size with greater than 50%  respiratory variability, suggesting right atrial pressure of 3 mmHg.      Neuro/Psych Seizures - (questionable, in setting of polypharmacy and severe OSA), Well Controlled,     GI/Hepatic ,GERD  ,,(+) neg Cirrhosis        Endo/Other    Renal/GU Renal disease     Musculoskeletal  (+) Arthritis , Osteoarthritis,    Abdominal   Peds  Hematology  (+) Blood dyscrasia, anemia   Anesthesia Other  Findings   Reproductive/Obstetrics                              Anesthesia Physical Anesthesia Plan  ASA: 3  Anesthesia Plan: Spinal   Post-op Pain Management:    Induction: Intravenous  PONV Risk Score and Plan: 2 and Ondansetron  Airway Management Planned: Natural Airway and Simple Face Mask  Additional Equipment:   Intra-op Plan:   Post-operative Plan:   Informed Consent: I have reviewed the patients History and Physical, chart, labs and discussed the procedure including the risks, benefits and alternatives for the proposed anesthesia with the patient or authorized representative who has indicated his/her understanding and acceptance.     Dental advisory given  Plan Discussed with: CRNA  Anesthesia Plan Comments:         Anesthesia Quick Evaluation

## 2023-11-26 ENCOUNTER — Encounter (HOSPITAL_COMMUNITY): Payer: Self-pay | Admitting: Orthopedic Surgery

## 2023-11-26 ENCOUNTER — Telehealth: Payer: Self-pay | Admitting: Neurology

## 2023-11-26 ENCOUNTER — Other Ambulatory Visit (HOSPITAL_COMMUNITY): Payer: Self-pay

## 2023-11-26 DIAGNOSIS — E871 Hypo-osmolality and hyponatremia: Secondary | ICD-10-CM | POA: Diagnosis not present

## 2023-11-26 DIAGNOSIS — M87052 Idiopathic aseptic necrosis of left femur: Secondary | ICD-10-CM | POA: Diagnosis not present

## 2023-11-26 LAB — CBC
HCT: 26.7 % — ABNORMAL LOW (ref 36.0–46.0)
Hemoglobin: 8.8 g/dL — ABNORMAL LOW (ref 12.0–15.0)
MCH: 28.8 pg (ref 26.0–34.0)
MCHC: 33 g/dL (ref 30.0–36.0)
MCV: 87.3 fL (ref 80.0–100.0)
Platelets: 212 K/uL (ref 150–400)
RBC: 3.06 MIL/uL — ABNORMAL LOW (ref 3.87–5.11)
RDW: 12.9 % (ref 11.5–15.5)
WBC: 13 K/uL — ABNORMAL HIGH (ref 4.0–10.5)
nRBC: 0 % (ref 0.0–0.2)

## 2023-11-26 LAB — BASIC METABOLIC PANEL WITH GFR
Anion gap: 9 (ref 5–15)
BUN: 9 mg/dL (ref 8–23)
CO2: 24 mmol/L (ref 22–32)
Calcium: 8.1 mg/dL — ABNORMAL LOW (ref 8.9–10.3)
Chloride: 97 mmol/L — ABNORMAL LOW (ref 98–111)
Creatinine, Ser: 0.33 mg/dL — ABNORMAL LOW (ref 0.44–1.00)
GFR, Estimated: >60 mL/min (ref >60)
Glucose, Bld: 108 mg/dL — ABNORMAL HIGH (ref 70–99)
Potassium: 4.1 mmol/L (ref 3.5–5.1)
Sodium: 129 mmol/L — ABNORMAL LOW (ref 135–145)

## 2023-11-26 MED ORDER — ASPIRIN 325 MG PO TBEC
325.0000 mg | DELAYED_RELEASE_TABLET | Freq: Two times a day (BID) | ORAL | 0 refills | Status: AC
  Filled 2023-11-26: qty 60, 30d supply, fill #0

## 2023-11-26 MED ORDER — OXYCODONE HCL 5 MG PO TABS
5.0000 mg | ORAL_TABLET | ORAL | 0 refills | Status: DC | PRN
  Filled 2023-11-26: qty 30, 5d supply, fill #0

## 2023-11-26 MED ORDER — ONDANSETRON HCL 4 MG PO TABS
4.0000 mg | ORAL_TABLET | Freq: Three times a day (TID) | ORAL | 0 refills | Status: DC | PRN
  Filled 2023-11-26: qty 10, 4d supply, fill #0

## 2023-11-26 MED ORDER — SENNA-DOCUSATE SODIUM 8.6-50 MG PO TABS
2.0000 | ORAL_TABLET | Freq: Every day | ORAL | 1 refills | Status: AC
  Filled 2023-11-26: qty 30, 15d supply, fill #0

## 2023-11-26 NOTE — Progress Notes (Signed)
 Physical Therapy Treatment Patient Details Name: Cassandra Spears MRN: [id number] DOB: 1950/06/18 Today's Date: 11/26/2023   History of Present Illness 73 yo female presents to therapy s/p L THA, posterior approach on 11/25/2023 due to avascular necrosis with collapse and  failure of conservative measures. Pt is currently L LE WBAT and no formal posterior hip precautions. Pt PMH includes but is not limited to: anemia, arthritis, asthma, GERD, heart murmur, vertebral compression, 09/2023 neurological event with AMS, HTN, OSA, B ankle fx s/p R ORIF and L cast.    PT Comments  Pt making good progress.  She would like second session to advance stair training and HEP.  Pt with good pain control and ambulating 80' with RW.  Expect that pt will be safe to return home from PT perspective after next PT session.     If plan is discharge home, recommend the following: A little help with walking and/or transfers;A little help with bathing/dressing/bathroom;Assistance with cooking/housework;Assist for transportation   Can travel by private vehicle        Equipment Recommendations  None recommended by PT    Recommendations for Other Services       Precautions / Restrictions Precautions Precautions: Fall Precaution/Restrictions Comments: NO posterior precautions Restrictions Weight Bearing Restrictions Per Provider Order: Yes LLE Weight Bearing Per Provider Order: Weight bearing as tolerated     Mobility  Bed Mobility               General bed mobility comments: in chair    Transfers Overall transfer level: Needs assistance Equipment used: Rolling walker (2 wheels) Transfers: Sit to/from Stand Sit to Stand: Supervision                Ambulation/Gait Ambulation/Gait assistance: Contact guard assist, Supervision Gait Distance (Feet): 80 Feet Assistive device: Rolling walker (2 wheels) Gait Pattern/deviations: Step-to pattern, Decreased stance time - left Gait velocity:  functional but decreased     General Gait Details: Min cues for posture;progressed to supervision   Stairs Stairs:  (initial report ramp but does report 1 step, will attempt in pm)           Wheelchair Mobility     Tilt Bed    Modified Rankin (Stroke Patients Only)       Balance Overall balance assessment: Needs assistance Sitting-balance support: Feet supported Sitting balance-Leahy Scale: Good     Standing balance support: Bilateral upper extremity supported, During functional activity, Reliant on assistive device for balance Standing balance-Leahy Scale: Poor Standing balance comment: steady wtih RW                            Communication    Cognition Arousal: Alert Behavior During Therapy: WFL for tasks assessed/performed   PT - Cognitive impairments: No apparent impairments                                Cueing    Exercises Total Joint Exercises Ankle Circles/Pumps: AROM, Both, 10 reps, Supine Quad Sets: AROM, Both, 10 reps, Supine Heel Slides: AAROM, Left, 5 reps, Supine Long Arc Quad: AROM, Left, 10 reps, Seated    General Comments        Pertinent Vitals/Pain Pain Assessment Pain Assessment: 0-10 Pain Score: 2  Pain Location: L hip and LE Pain Descriptors / Indicators: Sore Pain Intervention(s): Limited activity within patient's tolerance, Monitored during session, Premedicated before  session, Repositioned, Ice applied    Home Living                          Prior Function            PT Goals (current goals can now be found in the care plan section) Progress towards PT goals: Progressing toward goals    Frequency    7X/week      PT Plan      Co-evaluation              AM-PAC PT 6 Clicks Mobility   Outcome Measure  Help needed turning from your back to your side while in a flat bed without using bedrails?: A Little Help needed moving from lying on your back to sitting on the side  of a flat bed without using bedrails?: A Little Help needed moving to and from a bed to a chair (including a wheelchair)?: A Little Help needed standing up from a chair using your arms (e.g., wheelchair or bedside chair)?: A Little Help needed to walk in hospital room?: A Little Help needed climbing 3-5 steps with a railing? : A Little 6 Click Score: 18    End of Session Equipment Utilized During Treatment: Gait belt Activity Tolerance: Patient tolerated treatment well Patient left: in chair;with call bell/phone within reach;with family/visitor present;with chair alarm set Nurse Communication: Mobility status PT Visit Diagnosis: Unsteadiness on feet (R26.81);Other abnormalities of gait and mobility (R26.89);Muscle weakness (generalized) (M62.81);Difficulty in walking, not elsewhere classified (R26.2);Pain Pain - Right/Left: Left Pain - part of body: Leg;Hip     Time: 8881-8862 PT Time Calculation (min) (ACUTE ONLY): 19 min  Charges:    $Gait Training: 8-22 mins PT General Charges $$ ACUTE PT VISIT: 1 Visit                     [place], PT Acute Rehab Services [facility] Re[facility] [phone]    [place] VEAR Mul[place] 11/26/2023, 11:53 AM

## 2023-11-26 NOTE — Care Management Obs Status (Signed)
 MEDICARE OBSERVATION STATUS NOTIFICATION   Patient Details  Name: AHLEY BULLS MRN: [id number] Date of Birth: Sep 12, 1950   Medicare Observation Status Notification Given:  Chaney NORMAN ASPEN, LCSW 11/26/2023, 12:54 PM

## 2023-11-26 NOTE — Progress Notes (Addendum)
 Subjective: 1 Day Post-Op s/p Procedure(s): ARTHROPLASTY, HIP, TOTAL,POSTERIOR APPROACH   Patient is alert, oriented. Reports pain has been intermittent but pain medication working well. Not yet voided on own the AM after foley pulled.  Denies chest pain, SOB, Calf pain. No nausea/vomiting.  No other complaints.     Objective:  PE: VITALS:   Vitals:   11/26/23 0530 11/26/23 0842 11/26/23 0908 11/26/23 0934  BP: (!) 148/75 (!) 148/75  (!) 119/55  Pulse: 89   82  Resp: 19   18  Temp: 97.9 F (36.6 C)   99 F (37.2 C)  TempSrc: Oral     SpO2: 100%  99% 100%  Weight:      Height:       Sitting up in chair in no acute distress Normal respiratory effort MSK:  Sensation intact distally Intact pulses distally Dorsiflexion/Plantar flexion intact Incision: dressing C/D/I  LABS  Results for orders placed or performed during the hospital encounter of 11/25/23 (from the past 24 hours)  CBC     Status: Abnormal   Collection Time: 11/26/23  3:18 AM  Result Value Ref Range   WBC 13.0 (H) 4.0 - 10.5 K/uL   RBC 3.06 (L) 3.87 - 5.11 MIL/uL   Hemoglobin 8.8 (L) 12.0 - 15.0 g/dL   HCT 73.2 (L) 63.9 - 53.9 %   MCV 87.3 80.0 - 100.0 fL   MCH 28.8 26.0 - 34.0 pg   MCHC 33.0 30.0 - 36.0 g/dL   RDW 87.0 88.4 - 84.4 %   Platelets 212 150 - 400 K/uL   nRBC 0.0 0.0 - 0.2 %  Basic metabolic panel     Status: Abnormal   Collection Time: 11/26/23  3:18 AM  Result Value Ref Range   Sodium 129 (L) 135 - 145 mmol/L   Potassium 4.1 3.5 - 5.1 mmol/L   Chloride 97 (L) 98 - 111 mmol/L   CO2 24 22 - 32 mmol/L   Glucose, Bld 108 (H) 70 - 99 mg/dL   BUN 9 8 - 23 mg/dL   Creatinine, Ser 9.66 (L) 0.44 - 1.00 mg/dL   Calcium  8.1 (L) 8.9 - 10.3 mg/dL   GFR, Estimated >39 >39 mL/min   Anion gap 9 5 - 15    DG HIP UNILAT W OR W/O PELVIS 2-3 VIEWS LEFT Result Date: 11/25/2023 CLINICAL DATA:  Status post left hip arthroplasty. EXAM: DG HIP (WITH OR WITHOUT PELVIS) 2-3V LEFT COMPARISON:  None  Available. FINDINGS: Left hip arthroplasty in expected alignment. No periprosthetic lucency or fracture. Recent postsurgical change includes air and edema in the soft tissues. IMPRESSION: Left hip arthroplasty without immediate postoperative complication. Electronically Signed   By: Andrea Gasman M.D.   On: 11/25/2023 15:27    Today's  total administered Morphine Milligram Equivalents: 30 Yesterday's total administered Morphine Milligram Equivalents: 107.5  Assessment/Plan:  Left hip AVN with collapse 1 Day Post-Op s/p Procedure(s): ARTHROPLASTY, HIP, TOTAL,POSTERIOR APPROACH  Weightbearing: WBAT LLE, up with therapy, patient was wheelchair bound prior to surgery due to collapse of femoral head so far progressing well with PT Insicional and dressing care: Reinforce dressings as needed VTE prophylaxis: Aspirin  325mg  BID x 30 days Pain control: continue current regimen Follow - up plan: 2 weeks with Josefina Dispo: pending passing stair training, hopeful to discharge home this afternoon   Contact information:   Army Daring, PA-C Weekdays 8-5  After hours and holidays please check [email or web address] for group call information for [facility]  Army MARLA Daring 11/26/2023, 10:59 AM

## 2023-11-26 NOTE — Progress Notes (Signed)
 Discharge medications delivered to patient at bedside in a secure bag.

## 2023-11-26 NOTE — TOC Transition Note (Signed)
 Transition of Care [facility]) - Discharge Note   Patient Details  Name: Cassandra Spears MRN: [id number] Date of Birth: [birth year]  [facility]) CM/SW Contact:  NORMAN ASPEN, LCSW Phone Number: 11/26/2023, 10:12 AM   Clinical Narrative:     Met with pt who confirms she has needed DME in the home.  OPPT already arranged with [facility] ([place]).  No further IP CM needs.  Final next level of care: OP Rehab Barriers to Discharge: No Barriers Identified   Patient Goals and CMS Choice Patient states their goals for this hospitalization and ongoing recovery are:: return home          Discharge Placement                       Discharge Plan and Services Additional resources added to the After Visit Summary for                  DME Arranged: N/A DME Agency: NA                  Social Drivers of Health (SDOH) Interventions SDOH Screenings   Food Insecurity: No Food Insecurity (11/25/2023)  Housing: Low Risk  (11/25/2023)  Transportation Needs: No Transportation Needs (11/25/2023)  Utilities: Not At Risk (11/25/2023)  Social Connections: Socially Integrated (11/25/2023)  Tobacco Use: Low Risk  (11/25/2023)     Readmission Risk Interventions     No data to display

## 2023-11-26 NOTE — H&P (View-Only) (Signed)
 Subjective: 1 Day Post-Op s/p Procedure(s): ARTHROPLASTY, HIP, TOTAL,POSTERIOR APPROACH   Patient is alert, oriented. Reports pain has been intermittent but pain medication working well. Not yet voided on own the AM after foley pulled.  Denies chest pain, SOB, Calf pain. No nausea/vomiting.  No other complaints.     Objective:  PE: VITALS:   Vitals:   11/26/23 0530 11/26/23 0842 11/26/23 0908 11/26/23 0934  BP: (!) 148/75 (!) 148/75  (!) 119/55  Pulse: 89   82  Resp: 19   18  Temp: 97.9 F (36.6 C)   99 F (37.2 C)  TempSrc: Oral     SpO2: 100%  99% 100%  Weight:      Height:       Sitting up in chair in no acute distress Normal respiratory effort MSK:  Sensation intact distally Intact pulses distally Dorsiflexion/Plantar flexion intact Incision: dressing C/D/I  LABS  Results for orders placed or performed during the hospital encounter of 11/25/23 (from the past 24 hours)  CBC     Status: Abnormal   Collection Time: 11/26/23  3:18 AM  Result Value Ref Range   WBC 13.0 (H) 4.0 - 10.5 K/uL   RBC 3.06 (L) 3.87 - 5.11 MIL/uL   Hemoglobin 8.8 (L) 12.0 - 15.0 g/dL   HCT 73.2 (L) 63.9 - 53.9 %   MCV 87.3 80.0 - 100.0 fL   MCH 28.8 26.0 - 34.0 pg   MCHC 33.0 30.0 - 36.0 g/dL   RDW 87.0 88.4 - 84.4 %   Platelets 212 150 - 400 K/uL   nRBC 0.0 0.0 - 0.2 %  Basic metabolic panel     Status: Abnormal   Collection Time: 11/26/23  3:18 AM  Result Value Ref Range   Sodium 129 (L) 135 - 145 mmol/L   Potassium 4.1 3.5 - 5.1 mmol/L   Chloride 97 (L) 98 - 111 mmol/L   CO2 24 22 - 32 mmol/L   Glucose, Bld 108 (H) 70 - 99 mg/dL   BUN 9 8 - 23 mg/dL   Creatinine, Ser 9.66 (L) 0.44 - 1.00 mg/dL   Calcium  8.1 (L) 8.9 - 10.3 mg/dL   GFR, Estimated >39 >39 mL/min   Anion gap 9 5 - 15    DG HIP UNILAT W OR W/O PELVIS 2-3 VIEWS LEFT Result Date: 11/25/2023 CLINICAL DATA:  Status post left hip arthroplasty. EXAM: DG HIP (WITH OR WITHOUT PELVIS) 2-3V LEFT COMPARISON:  None  Available. FINDINGS: Left hip arthroplasty in expected alignment. No periprosthetic lucency or fracture. Recent postsurgical change includes air and edema in the soft tissues. IMPRESSION: Left hip arthroplasty without immediate postoperative complication. Electronically Signed   By: Andrea Gasman M.D.   On: 11/25/2023 15:27    Today's  total administered Morphine Milligram Equivalents: 30 Yesterday's total administered Morphine Milligram Equivalents: 107.5  Assessment/Plan:  Left hip AVN with collapse 1 Day Post-Op s/p Procedure(s): ARTHROPLASTY, HIP, TOTAL,POSTERIOR APPROACH  Weightbearing: WBAT LLE, up with therapy, patient was wheelchair bound prior to surgery due to collapse of femoral head so far progressing well with PT Insicional and dressing care: Reinforce dressings as needed VTE prophylaxis: Aspirin  325mg  BID x 30 days Pain control: continue current regimen Follow - up plan: 2 weeks with Josefina Dispo: pending passing stair training, hopeful to discharge home this afternoon   Contact information:   Army Daring, PA-C Weekdays 8-5  After hours and holidays please check [email or web address] for group call information for [facility]  Army MARLA Daring 11/26/2023, 10:59 AM

## 2023-11-26 NOTE — Telephone Encounter (Signed)
 CPAP [employer] pending

## 2023-11-26 NOTE — Progress Notes (Signed)
 Physical Therapy Treatment Patient Details Name: JAWANDA PASSEY MRN: [id number] DOB: Oct 11, 1950 Today's Date: 11/26/2023   History of Present Illness 73 yo female presents to therapy s/p L THA, posterior approach on 11/25/2023 due to avascular necrosis with collapse and  failure of conservative measures. Pt is currently L LE WBAT and no formal posterior hip precautions. Pt PMH includes but is not limited to: anemia, arthritis, asthma, GERD, heart murmur, vertebral compression, 09/2023 neurological event with AMS, HTN, OSA, B ankle fx s/p R ORIF and L cast.    PT Comments  Pt is POD # 1 and is progressing well. Pt ambulating 130' with RW and supervision. She performed stairs similar to home set up.  Pt has DME and home support. Pt demonstrates safe gait & transfers in order to return home from PT perspective once discharged by MD.  While in hospital, will continue to benefit from PT for skilled therapy to advance mobility and exercises.       If plan is discharge home, recommend the following: A little help with walking and/or transfers;A little help with bathing/dressing/bathroom;Assistance with cooking/housework;Assist for transportation   Can travel by private vehicle        Equipment Recommendations  None recommended by PT    Recommendations for Other Services       Precautions / Restrictions Precautions Precautions: Fall Precaution/Restrictions Comments: NO posterior precautions Restrictions Weight Bearing Restrictions Per Provider Order: Yes LLE Weight Bearing Per Provider Order: Weight bearing as tolerated     Mobility  Bed Mobility               General bed mobility comments: in chair    Transfers Overall transfer level: Needs assistance Equipment used: Rolling walker (2 wheels) Transfers: Sit to/from Stand Sit to Stand: Supervision                Ambulation/Gait Ambulation/Gait assistance: Supervision Gait Distance (Feet): 130 Feet Assistive device:  Rolling walker (2 wheels) Gait Pattern/deviations: Step-to pattern, Decreased stance time - left Gait velocity: functional but decreased     General Gait Details: Min cues for posture   Stairs Stairs: Yes Stairs assistance: Contact guard assist Stair Management: One rail Right, Forwards, Step to pattern Number of Stairs: 4 General stair comments: performed 4 , 6 steps with R rail and HHA on L (up) and L rail HHA R (down).  Pt tolerated well but needing increased cues for sequencing.  Provided instruction to spouse for cues and written instruction to pt   Wheelchair Mobility     Tilt Bed    Modified Rankin (Stroke Patients Only)       Balance Overall balance assessment: Needs assistance Sitting-balance support: Feet supported Sitting balance-Leahy Scale: Good     Standing balance support: Bilateral upper extremity supported, During functional activity, No upper extremity supported Standing balance-Leahy Scale: Fair Standing balance comment: steady wtih RW to ambulate; could static stand without UE suport                            Communication    Cognition Arousal: Alert Behavior During Therapy: WFL for tasks assessed/performed   PT - Cognitive impairments: No apparent impairments                                Cueing    Exercises Total Joint Exercises Ankle Circles/Pumps: AROM, Both, 10 reps, Supine  Quad Sets: AROM, Both, 10 reps, Supine Heel Slides: AAROM, Left, Supine, 10 reps Hip ABduction/ADduction: AAROM, Left, 10 reps, Supine Long Arc Quad: AROM, Left, 10 reps, Seated    General Comments  Educated on safe ice use, no pivots, car transfers, and TED hose during day. Also, encouraged walking every 1-2 hours during day for 5-10 mins. Educated on HEP with focus on mobility the first week. Discussed doing exercises within pain control and if pain increasing could decreased ROM, reps, and stop exercises as needed. Encouraged to perform  ankle pumps frequently for blood flow.       Pertinent Vitals/Pain Pain Assessment Pain Assessment: 0-10 Pain Score: 2  Pain Location: L hip and LE Pain Descriptors / Indicators: Sore Pain Intervention(s): Limited activity within patient's tolerance, Monitored during session, Premedicated before session, Repositioned, Ice applied    Home Living                          Prior Function            PT Goals (current goals can now be found in the care plan section) Progress towards PT goals: Progressing toward goals    Frequency    7X/week      PT Plan      Co-evaluation              AM-PAC PT 6 Clicks Mobility   Outcome Measure  Help needed turning from your back to your side while in a flat bed without using bedrails?: A Little Help needed moving from lying on your back to sitting on the side of a flat bed without using bedrails?: A Little Help needed moving to and from a bed to a chair (including a wheelchair)?: A Little Help needed standing up from a chair using your arms (e.g., wheelchair or bedside chair)?: A Little Help needed to walk in hospital room?: A Little Help needed climbing 3-5 steps with a railing? : A Little 6 Click Score: 18    End of Session Equipment Utilized During Treatment: Gait belt Activity Tolerance: Patient tolerated treatment well Patient left: in chair;with call bell/phone within reach;with family/visitor present;with chair alarm set Nurse Communication: Mobility status PT Visit Diagnosis: Unsteadiness on feet (R26.81);Other abnormalities of gait and mobility (R26.89);Muscle weakness (generalized) (M62.81);Difficulty in walking, not elsewhere classified (R26.2);Pain Pain - Right/Left: Left Pain - part of body: Leg;Hip     Time: 1351-1420 PT Time Calculation (min) (ACUTE ONLY): 29 min  Charges:    $Gait Training: 8-22 mins $Therapeutic Exercise: 8-22 mins PT General Charges $$ ACUTE PT VISIT: 1 Visit                      Benjiman, PT Acute Rehab [facility] [phone]    Benjiman VEAR Mulberry 11/26/2023, 2:32 PM

## 2023-11-27 ENCOUNTER — Other Ambulatory Visit (HOSPITAL_COMMUNITY): Payer: Self-pay

## 2023-11-27 ENCOUNTER — Telehealth: Payer: Self-pay

## 2023-11-27 DIAGNOSIS — G454 Transient global amnesia: Secondary | ICD-10-CM

## 2023-11-27 DIAGNOSIS — R9401 Abnormal electroencephalogram [EEG]: Secondary | ICD-10-CM

## 2023-11-27 DIAGNOSIS — G4733 Obstructive sleep apnea (adult) (pediatric): Secondary | ICD-10-CM

## 2023-11-27 DIAGNOSIS — R0683 Snoring: Secondary | ICD-10-CM

## 2023-11-27 DIAGNOSIS — G4701 Insomnia due to medical condition: Secondary | ICD-10-CM

## 2023-11-27 NOTE — Discharge Summary (Signed)
 Discharge Summary  Patient ID: ESMIRNA RAVAN MRN: [id number] DOB/AGE: 1950-04-24 73 y.o.  Admit date: 11/25/2023 Discharge date: 11/26/2023  Admission Diagnoses:  S/P total left hip arthroplasty  Discharge Diagnoses:  Principal Problem:   S/P total left hip arthroplasty   Past Medical History:  Diagnosis Date   Allergy    Anemia    Arthritis    Asthma    Chronic hyponatremia    GERD (gastroesophageal reflux disease)    Heart murmur    History of vertebral compression fracture    Hypertension    Osteoporosis    Sleep apnea    still being evaluated for CPAP    Surgeries: Procedure(s): ARTHROPLASTY, HIP, TOTAL,POSTERIOR APPROACH on 11/25/2023   Consultants (if any):   Discharged Condition: Improved  Hospital Course: KHAI TORBERT is an 73 y.o. female who was admitted 11/25/2023 with a diagnosis of S/P total left hip arthroplasty and went to the operating room on 11/25/2023 and underwent the above named procedures.    Today's  total administered Morphine Milligram Equivalents: 0 Yesterday's total administered Morphine Milligram Equivalents: 37.5  She was given perioperative antibiotics:  Anti-infectives (From admission, onward)    Start     Dose/Rate Route Frequency Ordered Stop   11/25/23 0800  ceFAZolin (ANCEF) IVPB 2g/100 mL premix        2 g 200 mL/hr over 30 Minutes Intravenous On call to O.R. 11/25/23 9267 11/25/23 9041     .  She was given sequential compression devices, early ambulation, and Aspirin  for DVT prophylaxis.  She benefited maximally from the hospital stay and there were no complications.    Recent vital signs:  Vitals:   11/26/23 0934 11/26/23 1333  BP: (!) 119/55 (!) 154/76  Pulse: 82 80  Resp: 18 17  Temp: 99 F (37.2 C) 98.9 F (37.2 C)  SpO2: 100% 100%    Recent laboratory studies:  Lab Results  Component Value Date   HGB 8.8 (L) 11/26/2023   HGB 12.1 11/21/2023   HGB 11.4 (L) 10/02/2023   Lab Results  Component Value  Date   WBC 13.0 (H) 11/26/2023   PLT 212 11/26/2023   Lab Results  Component Value Date   INR 0.9 10/01/2023   Lab Results  Component Value Date   NA 129 (L) 11/26/2023   K 4.1 11/26/2023   CL 97 (L) 11/26/2023   CO2 24 11/26/2023   BUN 9 11/26/2023   CREATININE 0.33 (L) 11/26/2023   GLUCOSE 108 (H) 11/26/2023    Discharge Medications:   Allergies as of 11/26/2023       Reactions   Cat Dander Other (See Comments)   Dog Epithelium (canis Lupus Familiaris) Other (See Comments)   Molds & Smuts Other (See Comments)   Other Other (See Comments)        Medication List     STOP taking these medications    traMADol  50 MG tablet Commonly known as: ULTRAM    Voltaren 1 % Gel Generic drug: diclofenac Sodium       TAKE these medications    acetaminophen  500 MG tablet Commonly known as: TYLENOL  Take 1,000 mg by mouth 4 (four) times daily as needed (pain.).   albuterol (2.5 MG/3ML) 0.083% nebulizer solution Commonly known as: PROVENTIL Take 2.5 mg by nebulization every 6 (six) hours as needed for wheezing or shortness of breath.   albuterol 108 (90 Base) MCG/ACT inhaler Commonly known as: VENTOLIN HFA Inhale 1-2 puffs into the lungs every 6 (  six) hours as needed for wheezing or shortness of breath.   aspirin  EC 325 MG tablet Take 1 tablet (325 mg total) by mouth 2 (two) times daily. What changed:  medication strength how much to take when to take this additional instructions   azelastine 0.1 % nasal spray Commonly known as: ASTELIN Place 1 spray into both nostrils in the morning. Use in each nostril as directed   betamethasone dipropionate 0.05 % cream Apply 1 Application topically daily.   Breo Ellipta  100-25 MCG/INH Aepb Generic drug: fluticasone  furoate-vilanterol Inhale 1 puff into the lungs in the morning.   CULTURELLE PROBIOTICS PO Take 1 tablet by mouth in the morning.   diclofenac 75 MG EC tablet Commonly known as: VOLTAREN Take 75 mg by  mouth 2 (two) times daily.   docusate sodium  100 MG capsule Commonly known as: COLACE Take 100 mg by mouth every evening.   esomeprazole 20 MG capsule Commonly known as: NEXIUM Take 20 mg by mouth every evening.   estradiol 0.1 MG/GM vaginal cream Commonly known as: ESTRACE Place 1 Applicatorful vaginally 2 (two) times a week.   fexofenadine 180 MG tablet Commonly known as: ALLEGRA Take 180 mg by mouth in the morning.   Finacea 15 % gel Generic drug: Azelaic Acid Apply 1 Application topically 2 (two) times daily. After skin is thoroughly washed and patted dry, gently but thoroughly massage a thin film of azelaic acid cream into the affected area twice daily, in the morning and evening.   FT CALCIUM -MAGNESIUM-ZINC-D3 PO Take 2 tablets by mouth in the morning and at bedtime.   KRILL OIL PO Take 1 capsule by mouth in the morning.   levETIRAcetam  500 MG tablet Commonly known as: KEPPRA  Take 1 tablet (500 mg total) by mouth 2 (two) times daily.   lisinopril  40 MG tablet Commonly known as: ZESTRIL  Take 40 mg by mouth in the morning.   mometasone 50 MCG/ACT nasal spray Commonly known as: NASONEX Place 1 spray into the nose at bedtime.   montelukast  10 MG tablet Commonly known as: SINGULAIR  Take 10 mg by mouth at bedtime.   MULTIVITAMIN ADULTS 50+ PO Take 1 tablet by mouth every other day. In the morning.   ondansetron 4 MG tablet Commonly known as: Zofran Take 1 tablet (4 mg total) by mouth every 8 (eight) hours as needed for nausea or vomiting.   oxyCODONE 5 MG immediate release tablet Commonly known as: Roxicodone Take 1 tablet (5 mg total) by mouth every 4 (four) hours as needed for severe pain (pain score 7-10).   Preparation H Totables Wipes 50 % Pads Apply 1 Application topically as needed (after bowel movements.).   RECLAST  IV Inject into the vein See admin instructions. Given once a year for Osteoporosis   rosuvastatin  10 MG tablet Commonly known as:  CRESTOR  Take 10 mg by mouth at bedtime.   Stool Softener/Laxative 50-8.6 MG tablet Generic drug: senna-docusate Take 2 tablets by mouth daily.   vitamin A & D ointment Apply 1 Application topically daily.   vitamin C 1000 MG tablet Take 1,000 mg by mouth 2 (two) times daily.   VITAMIN D  PO Take 2,000 Units by mouth daily.   Zaditor 0.025 % ophthalmic solution Generic drug: ketotifen Place 1 drop into both eyes 2 (two) times daily as needed (itchy eyes).        Diagnostic Studies: DG HIP UNILAT W OR W/O PELVIS 2-3 VIEWS LEFT Result Date: 11/25/2023 CLINICAL DATA:  Status post left hip  arthroplasty. EXAM: DG HIP (WITH OR WITHOUT PELVIS) 2-3V LEFT COMPARISON:  None Available. FINDINGS: Left hip arthroplasty in expected alignment. No periprosthetic lucency or fracture. Recent postsurgical change includes air and edema in the soft tissues. IMPRESSION: Left hip arthroplasty without immediate postoperative complication. Electronically Signed   By: Andrea Gasman M.D.   On: 11/25/2023 15:27   Home sleep test Result Date: 11/11/2023 Dohmeier, Dedra, MD     11/16/2023  6:20 PM    [facility] CANNON QUINTON 73 year old female 1950/09/23  HOME SLEEP TEST REPORT ( by Watch PAT)  STUDY DATE: 11-11-23 (11-13-2023)  ORDERING CLINICIAN: Dedra Gores, MD REFERRING CLINICIAN: Dr Onita Duos, MD PhD PCP  Dr Stephanie, MD , Dr Rojelio, DO  CLINICAL INFORMATION/HISTORY: this patient suffered a possible seizure or TIA , was evaluated in ED on 10-01-2023 and seen by Dr Onita on 10-14-2023, the differential dx included seizure and TGA.  She reportedly told Dr Onita that she needs surgical clearance by neurology. WILLENE HOLIAN is a 73 y.o. female and known snorer, a retired [employer] scheme manager, who is seen upon referral by Dr Onita on 10/16/2023. The patient was in the hospital after having chronic hip pain related insomnia, was severely sleep deprived and became confused on a Tuesday night , she was not  responding to her husbands  questions adequately, she repeated herself, didn't know how to use the cell phone, didn't know her name ( [place]) . 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  She presented only the next day to the hospital , CT brain was normal, has had an EEG and there was reportedly an abnormality in the left hemisphere- the MRI brain was normal. See Dr. Georgianne note:  Dr Onita wants to repeat EEG and she may be able to wean her off Keppra  . Patient was asked to stay on baby ASA  until 6 days before surgery, avoid tramadol .  She will only need to follow up with me if the HST reveals a sleep disorder    Epworth sleepiness score:  6/ 24 points  FSS endorsed at 42/ 63 points.  GDS 4/ 15  BMI: 37.4 kg/m  Neck Circumference:  14.5   Sleep Summary:  Total Recording Time (hours, min):   9 hours 3 minutes Total Sleep Time (hours, min):     7 hours 59 minutes          Percent REM (%):    19%                                  Respiratory Indices:  Calculated pAHI (per CMS guideline): Total AHI by CMS criteria was 46.6/h, which places this patient into the severe sleep apnea category.                      REM pAHI:   64.3/h                                            NREM pAHI: 44.8/h                          Positional AHI:   All sleep supine Snoring:     Mean volume of 56 dB  (!!!)  Oxygen Saturation Statistics:  Oxygen Saturation (%) Mean:   The mean oxygen saturation was 93% and varied between the nadir at 72 with a maximum saturation of 100%.  Hypoxia time total under 89% saturation was 30 minutes and hypoxia under 90% saturation was 42 minutes.  The hypoxic burden was 94 events per hour. This is clinically significant.          Pulse Rate Statistics:  Pulse Mean (bpm):    Mean heart rate 75 bpm, varying between 63 and 103 bpm.                   IMPRESSION:  This HST confirms the presence of severe sleep apnea which is accentuated by REM sleep but not REM sleep  dependent.  This apnea was associated with clinically significant degrees of hypoxemia.  This patient should be on positive airway pressure therapy before undergoing any surgical procedure under anesthesia.  RECOMMENDATION: I will initiate an in-lab sleep study for this patient to titrate her to positive airway pressure and possibly, if needed, to oxygen.  I would want this procedure to be done and the patient on therapy before she has any surgical procedure.  I will share my observation with Dr. Onita, and the patient's primary care physician. I would also ask her primary care physician to initiate a weight loss regimen with the patient. This study cannot rule out that the patient had a seizure or a  mini stroke , but it is possible  that this degree of sleep apnea and hypoxia could have significantly contributed to her confusional state. Any patient should be cautioned not to drive, work at heights, or operate dangerous or heavy equipment when tired or sleepy. Review of good sleep hygiene measures is accessible to any sleep clinic patient and can be reiterated through online material- I we recommend the Guide to better Sleep   by the NIH. Weight loss and Core Strength improvement is highly recommended for individuals with low muscle tone and/ or a BMI over 30. Any CPAP patient should be reminded to be fully compliant with PAP therapy , (defined as using PAP therapy for more than 4 hours each night ) with the goal to improve sleep related symptoms and decrease long term cardiovascular risks. Any PAP therapy patient should be reminded, that it may take up to 3 months to get fully used to using PAP and it may take 1-2 weeks for an established CPAP user to acclimatize to changes in pressure or mask. The earlier full compliance is achieved, the better long term compliance tends to be. Please note that untreated obstructive sleep apnea may carry additional perioperative morbidity. Patients with significant obstructive  sleep apnea should receive perioperative PAP therapy and the surgical team should be informed of the diagnosis and degree of sleep disordered breathing. Sleep fragmentation in the presence of normal proportional sleep stages is a nonspecific findings and per se does not signify an intrinsic sleep disorder or a cause for the patient's sleep-related symptoms. Causes include (but are not limited to) the unfamiliarity of sleeping while recorded by HST device or sleeping in a sleep lab for a full Polysomnography sleep study, but also circadian rhythm disturbances, medication side effects or an underlying mood disorder or medical problem. The referring physician will be notified of the test results.  INTERPRETING PHYSICIAN:  Dedra Gores, MD [facility] Sleep Board certified by The [employer] of Sleep Medicine and Diplomate of the [employer] of  Sleep Medicine. Board certified In Neurology through the ABPN, Fellow of the [employer] of Neurology.                  Disposition: Discharge disposition: 01-Home or Self Care          Follow-up Information     Josefina Chew, MD. Go on 12/10/2023.   Specialty: Orthopedic Surgery Why: your appointment is scheduled for 3:45 Contact information: [address] [place] [address] [phone]         [place] [place]. Go on 11/28/2023.   Why: Your outpatient physical therapy has been scheduled. the office will call you with a time Contact information: [phone]                 Signed: Army MARLA Daring PA-C 11/27/2023, 6:59 AM

## 2023-11-28 NOTE — Telephone Encounter (Signed)
 Teresa at [facility] is asking dor pressure setting for cpap please call [phone]

## 2023-12-01 DIAGNOSIS — M25552 Pain in left hip: Secondary | ICD-10-CM | POA: Diagnosis not present

## 2023-12-01 DIAGNOSIS — M6281 Muscle weakness (generalized): Secondary | ICD-10-CM | POA: Diagnosis not present

## 2023-12-01 DIAGNOSIS — R262 Difficulty in walking, not elsewhere classified: Secondary | ICD-10-CM | POA: Diagnosis not present

## 2023-12-01 NOTE — Telephone Encounter (Signed)
 RE: autopap settings needed Received: Today Zott, Glade Salt, Nena RAMAN, RN; Lavon Milling; Darrel Boyer Got It Thank you     Previous Messages    ----- Message ----- From: Salt Nena RAMAN, RN Sent: 12/01/2023   2:25 PM EDT To: Milling Donnice Boyer Darrel; Glade Zott Subject: autopap settings needed                        New order in EPIC   (urgent)  Cassandra Spears. Cassandra Spears Female, 73 y.o., [birth year] MRN: [id number]   Thanks Particia RN

## 2023-12-01 NOTE — Telephone Encounter (Signed)
 Consulted Dr. Dawayne,  Autopap 5-20cm RESMED ordered.  Order placed.  Once signed then will notifiy [facility].

## 2023-12-01 NOTE — Addendum Note (Signed)
 Addended by: NEYSA NENA RAMAN on: 12/01/2023 08:16 AM   Modules accepted: Orders

## 2023-12-03 DIAGNOSIS — M6281 Muscle weakness (generalized): Secondary | ICD-10-CM | POA: Diagnosis not present

## 2023-12-03 DIAGNOSIS — R262 Difficulty in walking, not elsewhere classified: Secondary | ICD-10-CM | POA: Diagnosis not present

## 2023-12-03 DIAGNOSIS — M25552 Pain in left hip: Secondary | ICD-10-CM | POA: Diagnosis not present

## 2023-12-07 DIAGNOSIS — M25552 Pain in left hip: Secondary | ICD-10-CM | POA: Diagnosis not present

## 2023-12-07 DIAGNOSIS — M25511 Pain in right shoulder: Secondary | ICD-10-CM | POA: Diagnosis not present

## 2023-12-08 NOTE — Telephone Encounter (Signed)
 CPAP Cassandra Spears: [id number] (exp. 11/26/23 to 02/16/24)

## 2023-12-11 NOTE — Telephone Encounter (Signed)
 [facility] faxed over a progress note that they tried to reach out to the patient about the medical equipment Dr. Chalice ordered for the patient. I called the patient and informed her that Dr. Chalice still wants her to do the sleep study at the in-office lab, but she also ordered a Autopap machine for her to use while she waits to do the in-office sleep study.   Patient stated she will need to do the study late November or early December due to her recent surgery and being on pain medication at this time. S/P total left hip arthroplasty was done on 11/25/23. She also has been having issues with her right shoulder, which has also been making it difficulty to sleep comfortably.   Patient plans to call [facility] to get machine phone number was provided. Patient also stated that she is fine with 01/20/24 sleep study appointment that is currently on hold. Patient did have questions about if the sleep study is covered by her insurance and the cost of the study.

## 2023-12-11 NOTE — Telephone Encounter (Signed)
 Goal had been initiation of PAP therapy  prior to surgery.   She underwent surgery without  PAP being in place?  Her apnea was severe enough to worry me about perioperative safety  but  now that she has had surgery, we can see her back for in lab testing.

## 2023-12-11 NOTE — Telephone Encounter (Signed)
 Sent mychart

## 2023-12-12 DIAGNOSIS — D649 Anemia, unspecified: Secondary | ICD-10-CM | POA: Diagnosis not present

## 2023-12-12 DIAGNOSIS — M25552 Pain in left hip: Secondary | ICD-10-CM | POA: Diagnosis not present

## 2023-12-12 DIAGNOSIS — R262 Difficulty in walking, not elsewhere classified: Secondary | ICD-10-CM | POA: Diagnosis not present

## 2023-12-12 DIAGNOSIS — M6281 Muscle weakness (generalized): Secondary | ICD-10-CM | POA: Diagnosis not present

## 2023-12-12 DIAGNOSIS — R350 Frequency of micturition: Secondary | ICD-10-CM | POA: Diagnosis not present

## 2023-12-15 DIAGNOSIS — M25552 Pain in left hip: Secondary | ICD-10-CM | POA: Diagnosis not present

## 2023-12-15 DIAGNOSIS — R262 Difficulty in walking, not elsewhere classified: Secondary | ICD-10-CM | POA: Diagnosis not present

## 2023-12-15 DIAGNOSIS — M6281 Muscle weakness (generalized): Secondary | ICD-10-CM | POA: Diagnosis not present

## 2023-12-16 NOTE — Telephone Encounter (Signed)
 CPAP Mylene barrows: [id number] (exp. 11/26/23 to 02/16/24)   Patient is scheduled at [facility] for 01/20/24 at 8 pm.  Mailed packet and sent mychart.

## 2023-12-18 DIAGNOSIS — M25552 Pain in left hip: Secondary | ICD-10-CM | POA: Diagnosis not present

## 2023-12-18 DIAGNOSIS — M6281 Muscle weakness (generalized): Secondary | ICD-10-CM | POA: Diagnosis not present

## 2023-12-18 DIAGNOSIS — R262 Difficulty in walking, not elsewhere classified: Secondary | ICD-10-CM | POA: Diagnosis not present

## 2023-12-23 DIAGNOSIS — M25552 Pain in left hip: Secondary | ICD-10-CM | POA: Diagnosis not present

## 2023-12-23 DIAGNOSIS — R262 Difficulty in walking, not elsewhere classified: Secondary | ICD-10-CM | POA: Diagnosis not present

## 2023-12-23 DIAGNOSIS — M6281 Muscle weakness (generalized): Secondary | ICD-10-CM | POA: Diagnosis not present

## 2023-12-23 NOTE — Telephone Encounter (Signed)
 I called the patient in regards to a fax we received about her order for the CPAP being cancelled. She stated she did receive calls from a strange number and when she or her husband answered the line would drop and there was never a message left the times they did not answer. I called the [facility] location for [facility] and explained the situation and verified the patient's phone numbers. They plan to call the patient again today to get her set up for a machine.    Patient has a doctor's appointment Hayden) to evaluate her current condition. She once she gets more information about pain medication use and rotator cuff concerns she will call and officially set up lab appointment. Patient is aware PA expires in December of this year.

## 2023-12-24 DIAGNOSIS — M25521 Pain in right elbow: Secondary | ICD-10-CM | POA: Diagnosis not present

## 2023-12-24 DIAGNOSIS — S52121A Displaced fracture of head of right radius, initial encounter for closed fracture: Secondary | ICD-10-CM | POA: Diagnosis not present

## 2023-12-24 DIAGNOSIS — S52021A Displaced fracture of olecranon process without intraarticular extension of right ulna, initial encounter for closed fracture: Secondary | ICD-10-CM | POA: Diagnosis not present

## 2023-12-24 DIAGNOSIS — Z96642 Presence of left artificial hip joint: Secondary | ICD-10-CM | POA: Diagnosis not present

## 2023-12-25 ENCOUNTER — Ambulatory Visit (HOSPITAL_COMMUNITY)
Admission: RE | Admit: 2023-12-25 | Discharge: 2023-12-25 | Disposition: A | Discharge status: Home / Self Care | Attending: Orthopaedic Surgery | Admitting: Orthopaedic Surgery

## 2023-12-25 ENCOUNTER — Ambulatory Visit (HOSPITAL_COMMUNITY): Admitting: Certified Registered Nurse Anesthetist

## 2023-12-25 ENCOUNTER — Encounter (HOSPITAL_COMMUNITY): Admission: RE | Disposition: A | Payer: Self-pay | Source: Home / Self Care | Attending: Orthopaedic Surgery

## 2023-12-25 ENCOUNTER — Encounter (HOSPITAL_COMMUNITY): Payer: Self-pay | Admitting: Orthopaedic Surgery

## 2023-12-25 ENCOUNTER — Ambulatory Visit (HOSPITAL_COMMUNITY)

## 2023-12-25 DIAGNOSIS — S52021A Displaced fracture of olecranon process without intraarticular extension of right ulna, initial encounter for closed fracture: Secondary | ICD-10-CM | POA: Diagnosis not present

## 2023-12-25 DIAGNOSIS — M792 Neuralgia and neuritis, unspecified: Secondary | ICD-10-CM | POA: Diagnosis not present

## 2023-12-25 DIAGNOSIS — I10 Essential (primary) hypertension: Secondary | ICD-10-CM | POA: Insufficient documentation

## 2023-12-25 DIAGNOSIS — S52121A Displaced fracture of head of right radius, initial encounter for closed fracture: Secondary | ICD-10-CM | POA: Insufficient documentation

## 2023-12-25 DIAGNOSIS — S53431A Radial collateral ligament sprain of right elbow, initial encounter: Secondary | ICD-10-CM | POA: Diagnosis not present

## 2023-12-25 DIAGNOSIS — J45909 Unspecified asthma, uncomplicated: Secondary | ICD-10-CM | POA: Insufficient documentation

## 2023-12-25 DIAGNOSIS — G8918 Other acute postprocedural pain: Secondary | ICD-10-CM | POA: Diagnosis not present

## 2023-12-25 DIAGNOSIS — S52021K Displaced fracture of olecranon process without intraarticular extension of right ulna, subsequent encounter for closed fracture with nonunion: Secondary | ICD-10-CM | POA: Diagnosis not present

## 2023-12-25 DIAGNOSIS — Z79899 Other long term (current) drug therapy: Secondary | ICD-10-CM | POA: Diagnosis not present

## 2023-12-25 DIAGNOSIS — S52001D Unspecified fracture of upper end of right ulna, subsequent encounter for closed fracture with routine healing: Secondary | ICD-10-CM | POA: Diagnosis not present

## 2023-12-25 DIAGNOSIS — G4730 Sleep apnea, unspecified: Secondary | ICD-10-CM | POA: Insufficient documentation

## 2023-12-25 DIAGNOSIS — X58XXXA Exposure to other specified factors, initial encounter: Secondary | ICD-10-CM | POA: Insufficient documentation

## 2023-12-25 DIAGNOSIS — S52121K Displaced fracture of head of right radius, subsequent encounter for closed fracture with nonunion: Secondary | ICD-10-CM | POA: Diagnosis not present

## 2023-12-25 DIAGNOSIS — K219 Gastro-esophageal reflux disease without esophagitis: Secondary | ICD-10-CM | POA: Insufficient documentation

## 2023-12-25 HISTORY — PX: RADIAL HEAD ARTHROPLASTY: SHX6044

## 2023-12-25 HISTORY — PX: LIGAMENT REPAIR: SHX5444

## 2023-12-25 HISTORY — PX: ORIF ELBOW FRACTURE: SHX5031

## 2023-12-25 SURGERY — OPEN REDUCTION INTERNAL FIXATION, FRACTURE, OLECRANON
Anesthesia: Regional | Site: Elbow | Laterality: Right

## 2023-12-25 MED ORDER — CEFAZOLIN SODIUM-DEXTROSE 2-4 GM/100ML-% IV SOLN
2.0000 g | INTRAVENOUS | Status: AC
  Administered 2023-12-25: 2 g via INTRAVENOUS
  Filled 2023-12-25: qty 100

## 2023-12-25 MED ORDER — ACETAMINOPHEN 500 MG PO TABS: 1000.0000 mg | ORAL_TABLET | Freq: Three times a day (TID) | ORAL | 0 refills | Status: AC

## 2023-12-25 MED ORDER — OXYCODONE HCL 5 MG PO TABS: ORAL_TABLET | ORAL | 0 refills | Status: AC

## 2023-12-25 MED ORDER — SUGAMMADEX SODIUM 200 MG/2ML IV SOLN
INTRAVENOUS | Status: AC
  Filled 2023-12-25: qty 2

## 2023-12-25 MED ORDER — DICLOFENAC SODIUM 75 MG PO TBEC: 75.0000 mg | DELAYED_RELEASE_TABLET | Freq: Two times a day (BID) | ORAL | 0 refills | Status: AC

## 2023-12-25 MED ORDER — ACETAMINOPHEN 500 MG PO TABS
1000.0000 mg | ORAL_TABLET | Freq: Once | ORAL | Status: AC
  Administered 2023-12-25: 1000 mg via ORAL
  Filled 2023-12-25: qty 2

## 2023-12-25 MED ORDER — FENTANYL CITRATE (PF) 100 MCG/2ML IJ SOLN
INTRAMUSCULAR | Status: DC | PRN
  Administered 2023-12-25 (×2): 50 ug via INTRAVENOUS

## 2023-12-25 MED ORDER — ROCURONIUM BROMIDE 10 MG/ML (PF) SYRINGE
PREFILLED_SYRINGE | INTRAVENOUS | Status: DC | PRN
  Administered 2023-12-25: 60 mg via INTRAVENOUS
  Administered 2023-12-25: 20 mg via INTRAVENOUS

## 2023-12-25 MED ORDER — ONDANSETRON HCL 4 MG/2ML IJ SOLN: 4.0000 mg | Freq: Once | INTRAMUSCULAR | Status: DC | PRN

## 2023-12-25 MED ORDER — ROCURONIUM BROMIDE 10 MG/ML (PF) SYRINGE
PREFILLED_SYRINGE | INTRAVENOUS | Status: AC
  Filled 2023-12-25: qty 10

## 2023-12-25 MED ORDER — ONDANSETRON HCL 4 MG/2ML IJ SOLN
INTRAMUSCULAR | Status: DC | PRN
Start: 2023-12-25 — End: 2023-12-25
  Administered 2023-12-25: 4 mg via INTRAVENOUS

## 2023-12-25 MED ORDER — PHENYLEPHRINE HCL (PRESSORS) 10 MG/ML IV SOLN
INTRAVENOUS | Status: AC
  Filled 2023-12-25: qty 1

## 2023-12-25 MED ORDER — OXYCODONE HCL 5 MG PO TABS: 5.0000 mg | ORAL_TABLET | Freq: Once | ORAL | Status: DC | PRN

## 2023-12-25 MED ORDER — LIDOCAINE 2% (20 MG/ML) 5 ML SYRINGE
INTRAMUSCULAR | Status: DC | PRN
  Administered 2023-12-25: 40 mg via INTRAVENOUS

## 2023-12-25 MED ORDER — AMISULPRIDE (ANTIEMETIC) 5 MG/2ML IV SOLN: 10.0000 mg | Freq: Once | INTRAVENOUS | Status: DC | PRN

## 2023-12-25 MED ORDER — FENTANYL CITRATE (PF) 100 MCG/2ML IJ SOLN
INTRAMUSCULAR | Status: AC
  Filled 2023-12-25: qty 2

## 2023-12-25 MED ORDER — ONDANSETRON HCL 4 MG/2ML IJ SOLN
INTRAMUSCULAR | Status: AC
  Filled 2023-12-25: qty 2

## 2023-12-25 MED ORDER — BUPIVACAINE-EPINEPHRINE (PF) 0.25% -1:200000 IJ SOLN
INTRAMUSCULAR | Status: AC
  Filled 2023-12-25: qty 30

## 2023-12-25 MED ORDER — DEXAMETHASONE SOD PHOSPHATE PF 10 MG/ML IJ SOLN
INTRAMUSCULAR | Status: DC | PRN
  Administered 2023-12-25: 10 mg via PERINEURAL

## 2023-12-25 MED ORDER — MIDAZOLAM HCL (PF) 2 MG/2ML IJ SOLN
1.0000 mg | INTRAMUSCULAR | Status: AC
  Administered 2023-12-25: 2 mg via INTRAVENOUS
  Filled 2023-12-25: qty 2

## 2023-12-25 MED ORDER — LACTATED RINGERS IV SOLN: INTRAVENOUS | Status: DC | PRN

## 2023-12-25 MED ORDER — PROPOFOL 10 MG/ML IV BOLUS
INTRAVENOUS | Status: DC | PRN
  Administered 2023-12-25: 100 mg via INTRAVENOUS

## 2023-12-25 MED ORDER — 0.9 % SODIUM CHLORIDE (POUR BTL) OPTIME
TOPICAL | Status: DC | PRN
  Administered 2023-12-25: 1000 mL

## 2023-12-25 MED ORDER — PROPOFOL 10 MG/ML IV BOLUS
INTRAVENOUS | Status: AC
  Filled 2023-12-25: qty 20

## 2023-12-25 MED ORDER — PHENYLEPHRINE HCL-NACL 20-0.9 MG/250ML-% IV SOLN
INTRAVENOUS | Status: DC | PRN
  Administered 2023-12-25 (×2): 40 ug/min via INTRAVENOUS

## 2023-12-25 MED ORDER — DEXAMETHASONE SOD PHOSPHATE PF 10 MG/ML IJ SOLN
INTRAMUSCULAR | Status: DC | PRN
  Administered 2023-12-25: 10 mg via INTRAVENOUS

## 2023-12-25 MED ORDER — OXYCODONE HCL 5 MG/5ML PO SOLN: 5.0000 mg | Freq: Once | ORAL | Status: DC | PRN

## 2023-12-25 MED ORDER — FENTANYL CITRATE (PF) 50 MCG/ML IJ SOSY
PREFILLED_SYRINGE | INTRAMUSCULAR | Status: AC
  Filled 2023-12-25: qty 2

## 2023-12-25 MED ORDER — LIDOCAINE HCL (PF) 2 % IJ SOLN
INTRAMUSCULAR | Status: AC
  Filled 2023-12-25: qty 5

## 2023-12-25 MED ORDER — SUGAMMADEX SODIUM 200 MG/2ML IV SOLN
INTRAVENOUS | Status: DC | PRN
  Administered 2023-12-25: 200 mg via INTRAVENOUS

## 2023-12-25 MED ORDER — PHENYLEPHRINE 80 MCG/ML (10ML) SYRINGE FOR IV PUSH (FOR BLOOD PRESSURE SUPPORT)
PREFILLED_SYRINGE | INTRAVENOUS | Status: AC
  Filled 2023-12-25: qty 10

## 2023-12-25 MED ORDER — FENTANYL CITRATE (PF) 50 MCG/ML IJ SOSY
25.0000 ug | PREFILLED_SYRINGE | INTRAMUSCULAR | Status: DC | PRN
  Administered 2023-12-25: 50 ug via INTRAVENOUS

## 2023-12-25 MED ORDER — GABAPENTIN 100 MG PO CAPS: 100.0000 mg | ORAL_CAPSULE | Freq: Three times a day (TID) | ORAL | 0 refills | Status: AC

## 2023-12-25 MED ORDER — ROPIVACAINE HCL 5 MG/ML IJ SOLN
INTRAMUSCULAR | Status: DC | PRN
Start: 2023-12-25 — End: 2023-12-25
  Administered 2023-12-25: 25 mL via PERINEURAL

## 2023-12-25 MED ORDER — VANCOMYCIN HCL 1000 MG IV SOLR
INTRAVENOUS | Status: AC
Start: 2023-12-25 — End: 2023-12-25
  Filled 2023-12-25: qty 20

## 2023-12-25 MED ORDER — LACTATED RINGERS IV SOLN: INTRAVENOUS | Status: DC

## 2023-12-25 MED ORDER — PHENYLEPHRINE 80 MCG/ML (10ML) SYRINGE FOR IV PUSH (FOR BLOOD PRESSURE SUPPORT)
PREFILLED_SYRINGE | INTRAVENOUS | Status: DC | PRN
  Administered 2023-12-25: 80 ug via INTRAVENOUS
  Administered 2023-12-25: 40 ug via INTRAVENOUS

## 2023-12-25 MED ORDER — FENTANYL CITRATE (PF) 50 MCG/ML IJ SOSY
50.0000 ug | PREFILLED_SYRINGE | INTRAMUSCULAR | Status: AC
  Administered 2023-12-25: 100 ug via INTRAVENOUS
  Filled 2023-12-25: qty 2

## 2023-12-25 MED ORDER — VANCOMYCIN HCL 1000 MG IV SOLR
INTRAVENOUS | Status: DC | PRN
  Administered 2023-12-25: 1000 mg via TOPICAL

## 2023-12-25 MED ORDER — ONDANSETRON HCL 4 MG PO TABS: 4.0000 mg | ORAL_TABLET | Freq: Three times a day (TID) | ORAL | 0 refills | Status: AC | PRN

## 2023-12-25 MED ORDER — ACETAMINOPHEN 500 MG PO TABS: 1000.0000 mg | ORAL_TABLET | Freq: Once | ORAL | Status: DC

## 2023-12-25 MED ORDER — CHLORHEXIDINE GLUCONATE 0.12 % MT SOLN: 15.0000 mL | Freq: Once | OROMUCOSAL | Status: DC

## 2023-12-25 SURGICAL SUPPLY — 105 items
ANCHOR SUT FIBERTAK DX DBL (Anchor) IMPLANT
BAG COUNTER SPONGE SURGICOUNT (BAG) IMPLANT
BAG ZIPLOCK 12X15 (MISCELLANEOUS) IMPLANT
BENZOIN TINCTURE PRP APPL 2/3 (GAUZE/BANDAGES/DRESSINGS) IMPLANT
BIT DRILL SOLID SSC 2.7X40 (DRILL) IMPLANT
BIT DRILL SOLID SSC 2.7X80 (DRILL) IMPLANT
BLADE HEX COATED 2.75 (ELECTRODE) ×2 IMPLANT
BLADE SURG 15 STRL LF DISP TIS (BLADE) ×2 IMPLANT
BLADE SURG SZ10 CARB STEEL (BLADE) IMPLANT
BLADE SW THK.38XMED LNG THN (BLADE) IMPLANT
BNDG COHESIVE 4X5 TAN STRL LF (GAUZE/BANDAGES/DRESSINGS) IMPLANT
BNDG ELASTIC 3X5.8 VLCR NS LF (GAUZE/BANDAGES/DRESSINGS) ×2 IMPLANT
BNDG ELASTIC 4INX 5YD STR LF (GAUZE/BANDAGES/DRESSINGS) ×4 IMPLANT
BNDG ESMARK 4X9 LF (GAUZE/BANDAGES/DRESSINGS) ×2 IMPLANT
BOOTIES KNEE HIGH SLOAN (MISCELLANEOUS) ×2 IMPLANT
BRUSH SCRUB EZ PLAIN DRY (MISCELLANEOUS) ×2 IMPLANT
CHLORAPREP W/TINT 26 (MISCELLANEOUS) IMPLANT
CLSR STERI-STRIP ANTIMIC 1/2X4 (GAUZE/BANDAGES/DRESSINGS) ×2 IMPLANT
CONNECTOR 5 IN 1 STRAIGHT STRL (MISCELLANEOUS) IMPLANT
COVER BACK TABLE 60X90IN (DRAPES) ×2 IMPLANT
COVER SURGICAL LIGHT HANDLE (MISCELLANEOUS) IMPLANT
CUFF TOURN SGL QUICK 18X4 (TOURNIQUET CUFF) ×2 IMPLANT
CUFF TRNQT CYL 24X4X16.5-23 (TOURNIQUET CUFF) IMPLANT
DRAPE C-ARM 42X120 X-RAY (DRAPES) ×2 IMPLANT
DRAPE C-ARMOR (DRAPES) ×2 IMPLANT
DRAPE EXTREMITY T 121X128X90 (DISPOSABLE) ×2 IMPLANT
DRAPE IMP U-DRAPE 54X76 (DRAPES) IMPLANT
DRAPE INCISE IOBAN 66X45 STRL (DRAPES) ×2 IMPLANT
DRAPE OEC MINIVIEW 54X84 (DRAPES) IMPLANT
DRAPE POUCH INSTRU U-SHP 10X18 (DRAPES) ×2 IMPLANT
DRAPE SHEET LG 3/4 BI-LAMINATE (DRAPES) ×2 IMPLANT
DRAPE SURG 17X23 STRL (DRAPES) ×2 IMPLANT
DRAPE SURG ORHT 6 SPLT 77X108 (DRAPES) ×4 IMPLANT
DRAPE U-SHAPE 47X51 STRL (DRAPES) ×2 IMPLANT
DRIVER QUICK CONNECT T10 (MISCELLANEOUS) IMPLANT
DRSG AQUACEL AG ADV 3.5X 6 (GAUZE/BANDAGES/DRESSINGS) ×2 IMPLANT
DRSG EMULSION OIL 3X3 NADH (GAUZE/BANDAGES/DRESSINGS) IMPLANT
DURAPREP 26ML APPLICATOR (WOUND CARE) ×2 IMPLANT
ELECT PENCIL ROCKER SW 15FT (MISCELLANEOUS) ×2 IMPLANT
ELECT REM PT RETURN 15FT ADLT (MISCELLANEOUS) ×2 IMPLANT
GAUZE 4X4 16PLY ~~LOC~~+RFID DBL (SPONGE) ×2 IMPLANT
GAUZE SPONGE 4X4 12PLY STRL (GAUZE/BANDAGES/DRESSINGS) ×2 IMPLANT
GAUZE XEROFORM 1X8 LF (GAUZE/BANDAGES/DRESSINGS) ×2 IMPLANT
GLOVE BIO SURGEON STRL SZ 6.5 (GLOVE) ×4 IMPLANT
GLOVE BIOGEL PI IND STRL 6.5 (GLOVE) ×2 IMPLANT
GLOVE BIOGEL PI IND STRL 8 (GLOVE) ×2 IMPLANT
GLOVE ECLIPSE 8.0 STRL XLNG CF (GLOVE) ×4 IMPLANT
GOWN STRL REUS W/ TWL LRG LVL3 (GOWN DISPOSABLE) ×2 IMPLANT
GOWN STRL REUS W/ TWL XL LVL3 (GOWN DISPOSABLE) IMPLANT
HEAD RADIAL ALIGN 20MM (Head) IMPLANT
HIBICLENS CHG 4% 4OZ BTL (MISCELLANEOUS) ×2 IMPLANT
KIT BASIN OR (CUSTOM PROCEDURE TRAY) ×2 IMPLANT
KIT FIBERTAK DX 1.6 DISP (KITS) IMPLANT
KIT TURNOVER KIT A (KITS) ×2 IMPLANT
KWIRE STD TIP 1.5X127 (WIRE) IMPLANT
KWIRE STD TIP 2X152 (WIRE) IMPLANT
MANIFOLD NEPTUNE II (INSTRUMENTS) IMPLANT
NDL HYPO 25X1 1.5 SAFETY (NEEDLE) IMPLANT
NDL MA TROC 1/2 (NEEDLE) IMPLANT
NEEDLE HYPO 25X1 1.5 SAFETY (NEEDLE) IMPLANT
NEEDLE MA TROC 1/2 (NEEDLE) ×1 IMPLANT
NS IRRIG 1000ML POUR BTL (IV SOLUTION) ×2 IMPLANT
PACK ORTHO EXTREMITY (CUSTOM PROCEDURE TRAY) ×2 IMPLANT
PACK SHOULDER (CUSTOM PROCEDURE TRAY) ×2 IMPLANT
PAD CAST 4YDX4 CTTN HI CHSV (CAST SUPPLIES) ×4 IMPLANT
PENCIL SMOKE EVACUATOR (MISCELLANEOUS) ×2 IMPLANT
PLATE PROX ULNA 151 9H RT (Plate) IMPLANT
RETRIEVER SUT HEWSON (MISCELLANEOUS) IMPLANT
SCREW COMP MTHRD 3.5X14 (Screw) IMPLANT
SCREW COMP MTHRD 3.5X16 (Screw) IMPLANT
SCREW CORT NLOCK PA 3.5X16 (Screw) IMPLANT
SCREW CORT NLOCK PA 3.5X20 (Screw) IMPLANT
SCREW GEMINUS PANL 3.5X18 (Screw) IMPLANT
SCREW LOCK MTHRD 3.5X14 NS (Screw) IMPLANT
SCREW LOCK MTHRD 3.5X22 (Screw) IMPLANT
SCREW MULTI-THRD LOCK 3.5X16 (Screw) IMPLANT
SLING ARM FOAM STRAP LRG (SOFTGOODS) ×2 IMPLANT
SLING ARM FOAM STRAP MED (SOFTGOODS) IMPLANT
SLING ARM FOAM STRAP XLG (SOFTGOODS) IMPLANT
SLING ARM IMMOBILIZER LRG (SOFTGOODS) IMPLANT
SLING ARM IMMOBILIZER MED (SOFTGOODS) IMPLANT
SPIKE FLUID TRANSFER (MISCELLANEOUS) IMPLANT
SPLINT PLASTER CAST XFAST 5X30 (CAST SUPPLIES) IMPLANT
SPLINT PLASTER GYPS XFAST 3X15 (CAST SUPPLIES) ×20 IMPLANT
SPONGE T-LAP 4X18 ~~LOC~~+RFID (SPONGE) ×2 IMPLANT
STAPLER SKIN PROX 35W (STAPLE) IMPLANT
STEM ELBOW RAD 9X4X33 (Stem) IMPLANT
STOCKINETTE 4X48 STRL (DRAPES) ×2 IMPLANT
STRIP CLOSURE SKIN 1/2X4 (GAUZE/BANDAGES/DRESSINGS) ×2 IMPLANT
SUCTION TUBE FRAZIER 10FR DISP (SUCTIONS) IMPLANT
SUCTION TUBE FRAZIER 12FR DISP (SUCTIONS) ×2 IMPLANT
SUT ETHILON 3 0 PS 1 (SUTURE) IMPLANT
SUT MNCRL AB 4-0 PS2 18 (SUTURE) ×2 IMPLANT
SUT VIC AB 0 CT1 27XBRD ANBCTR (SUTURE) ×2 IMPLANT
SUT VIC AB 0 CT1 36 (SUTURE) ×2 IMPLANT
SUT VIC AB 1 CT1 27XBRD ANBCTR (SUTURE) IMPLANT
SUT VIC AB 2-0 SH 27XBRD (SUTURE) ×2 IMPLANT
SUT VIC AB 3-0 SH 27X BRD (SUTURE) IMPLANT
SUTURE TAPE 1.3 40 TPR END (SUTURE) IMPLANT
SYR BULB EAR ULCER 3OZ GRN STR (SYRINGE) ×2 IMPLANT
SYR CONTROL 10ML LL (SYRINGE) ×2 IMPLANT
TOWEL OR 17X26 10 PK STRL BLUE (TOWEL DISPOSABLE) ×2 IMPLANT
TRAY PREMIUM WET SKIN SCRUB (MISCELLANEOUS) IMPLANT
TUBING CONNECTING 10 (TUBING) IMPLANT
YANKAUER SUCT BULB TIP NO VENT (SUCTIONS) IMPLANT

## 2023-12-25 NOTE — Anesthesia Postprocedure Evaluation (Signed)
 Anesthesia Post Note  Patient: Cassandra Spears  Procedure(s) Performed: OPEN REDUCTION INTERNAL FIXATION, FRACTURE, OLECRANON (Right: Elbow) ARTHROPLASTY, RADIUS, HEAD (Right: Elbow) REPAIR, LIGAMENT (Right: Elbow)     Patient location during evaluation: PACU Anesthesia Type: Regional and General Level of consciousness: awake and alert, oriented and patient cooperative Pain management: pain level controlled Vital Signs Assessment: post-procedure vital signs reviewed and stable Respiratory status: spontaneous breathing, nonlabored ventilation and respiratory function stable Cardiovascular status: blood pressure returned to baseline and stable Postop Assessment: no apparent nausea or vomiting Anesthetic complications: no Comments: C/o hip pain in pacu (hip replacement 4 weeks ago)   No notable events documented.  Last Vitals:  Vitals:   12/25/23 1143 12/25/23 1148  BP: (!) 149/71   Pulse: 82 81  Resp: 14 14  Temp:    SpO2: 100% 99%    Last Pain:  Vitals:   12/25/23 1027  TempSrc:   PainSc: 7                  [name]

## 2023-12-25 NOTE — Anesthesia Procedure Notes (Signed)
 Anesthesia Regional Block: Supraclavicular block   Pre-Anesthetic Checklist: , timeout performed,  Correct Patient, Correct Site, Correct Laterality,  Correct Procedure, Correct Position, site marked,  Risks and benefits discussed,  Surgical consent,  Pre-op evaluation,  At surgeon's request and post-op pain management  Laterality: Right  Prep: Maximum Sterile Barrier Precautions used, chloraprep       Needles:  Injection technique: Single-shot  Needle Type: Echogenic Stimulator Needle     Needle Length: 9cm  Needle Gauge: 22     Additional Needles:   Procedures:,,,, ultrasound used (permanent image in chart),,    Narrative:  Start time: 12/25/2023 11:40 AM End time: 12/25/2023 11:45 AM Injection made incrementally with aspirations every 5 mL.  Performed by: Personally  Anesthesiologist: Merla Almarie HERO, DO  Additional Notes: Monitors applied. No increased pain on injection. No increased resistance to injection. Injection made in 5cc increments. Good needle visualization. Patient tolerated procedure well.

## 2023-12-25 NOTE — Anesthesia Procedure Notes (Signed)
 Procedure Name: Intubation Date/Time: 12/25/2023 12:10 PM  Performed by: Merla Almarie HERO, DOPre-anesthesia Checklist: Patient identified, Emergency Drugs available, Suction available and Patient being monitored Patient Re-evaluated:Patient Re-evaluated prior to induction Oxygen Delivery Method: Circle system utilized Preoxygenation: Pre-oxygenation with 100% oxygen Induction Type: IV induction Ventilation: Mask ventilation without difficulty Laryngoscope Size: Mac and 3 Grade View: Grade II Tube type: Oral Tube size: 7.5 mm Number of attempts: 1 Airway Equipment and Method: Stylet and Oral airway Placement Confirmation: ETT inserted through vocal cords under direct vision, positive ETCO2 and breath sounds checked- equal and bilateral Secured at: 21 cm Tube secured with: Tape Dental Injury: Teeth and Oropharynx as per pre-operative assessment  Comments: Intubation by medical student under direct supervision, no issues.

## 2023-12-25 NOTE — H&P (Signed)
 I been asked to help with this case by my partner Dr. Lando.  In short patient had a recent total hip arthroplasty and had an injury sustained 2.5weeks ago.  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  The radial head is unreconstructable and will require radial head arthroplasty and the ulnar is angulated and displaced.  We discussed that she also has some preoperative ulnar neuritis and that would be appropriate to decompress the ulnar nerve, perform an open reduction term fixation of the ulna and a radial head replacement.  Will be prepared for a lateral ligament repair as well.  Risks and benefits including but limited to infection hardware prominence and instability of the joint as well as nonunion were discussed.  Patient elected to proceed.

## 2023-12-25 NOTE — Anesthesia Preprocedure Evaluation (Addendum)
 Anesthesia Evaluation  Patient identified by MRN, date of birth, ID band Patient awake    Reviewed: Allergy & Precautions, NPO status , Patient's Chart, lab work & pertinent test results  Airway Mallampati: II  TM Distance: >3 FB Neck ROM: Full    Dental  (+) Teeth Intact, Dental Advisory Given   Pulmonary asthma , sleep apnea (getting cpap soon)    Pulmonary exam normal breath sounds clear to auscultation       Cardiovascular hypertension (177/84 preop), Pt. on medications Normal cardiovascular exam Rhythm:Regular Rate:Normal     Neuro/Psych Seizures - (EEG nonconclusive, keppra  took today),   negative psych ROS   GI/Hepatic Neg liver ROS,GERD  Controlled and Medicated,,  Endo/Other  BMI 37  Renal/GU negative Renal ROS  negative genitourinary   Musculoskeletal  (+) Arthritis , Osteoarthritis,    Abdominal  (+) + obese  Peds  Hematology negative hematology ROS (+)   Anesthesia Other Findings   Reproductive/Obstetrics negative OB ROS                              Anesthesia Physical Anesthesia Plan  ASA: 3  Anesthesia Plan: General and Regional   Post-op Pain Management: Tylenol  PO (pre-op)* and Regional block*   Induction: Intravenous  PONV Risk Score and Plan: 3 and Ondansetron, Dexamethasone , Midazolam and Treatment may vary due to age or medical condition  Airway Management Planned: Oral ETT  Additional Equipment: None  Intra-op Plan:   Post-operative Plan: Extubation in OR  Informed Consent: I have reviewed the patients History and Physical, chart, labs and discussed the procedure including the risks, benefits and alternatives for the proposed anesthesia with the patient or authorized representative who has indicated his/her understanding and acceptance.     Dental advisory given  Plan Discussed with: CRNA  Anesthesia Plan Comments:          Anesthesia Quick  Evaluation

## 2023-12-25 NOTE — Progress Notes (Signed)
 Nerve block time out was 1134

## 2023-12-25 NOTE — Op Note (Signed)
 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  Patient's fracture was somewhat atypical with a 2-week late presentation to clinic.  Upon examination of her initial x-rays I felt that the radial head component looked even more atypical than a 2-week fracture would otherwise look.  After examining it in the operating room it was clear that the radial head had gone on to a fibrous union and had clearly been fractured for an extended period of time, much longer than the 2 weeks since the injury reported.  She had likely had insufficiency of the posterior lateral side of the elbow and had a fracture leaving her radial head unstable.  This turned into a Galeazzi variant fracture.  She had ulnar neuritis symptoms in the preoperative area and I felt that an ulnar nerve decompression was necessary.  Her proximal radial canal had completely sealed off in a corticated indicating that the fracture had been old.  This left osteopenia at the proximal aspect of the radius and the radius had a small crack as we began to broach.  I reinforced this with a cerclage.  Patient is at higher than normal risk of loss of fixation as well as instability.  We will keep her in a splint for a week and transition to a hinged elbow brace after that.  Post-operative plan: The patient will be nonweightbearing in the splint.  The patient will be discharged home.  DVT prophylaxis Aspirin  81 mg twice daily for 6 weeks.   Pain control with PRN pain medication preferring oral medicines.  Follow up plan will be  scheduled in approximately 7 days for incision check and XR.  Post-Op Diagnosis: Same Surgeons:Primary: Cristy Bonner DASEN, MD Assistants:Caroline McBane, PA-C Location: [facility] ROOM 07 Anesthesia: General with regional anesthesia Antibiotics: Ancef 2 g with local vancomycin powder 1 g at the surgical site Tourniquet time:  Total Tourniquet Time Documented: Upper Arm (Right) - 109 minutes Total: Upper Arm (Right) - 109 minutes  Estimated Blood Loss: Minimal Complications: None Specimens: None Implants: Implant Name Type Inv. Item Serial No. Manufacturer Lot No. LRB No. Used Action  SCREW MULTI-THRD LOCK 3.5X16 - [id number] Screw SCREW MULTI-THRD LOCK 3.5X16  [employer]  Right 2 Implanted  SCREW LOCK MTHRD 3.5X22 - [id number] Screw SCREW LOCK MTHRD 3.5X22  SKELET[employer]YNAM[employer]  Right 1 Implanted  SCREW COMP MTHRD 3.5X14 - [id number] Screw SCREW COMP MTHRD 3.5X14  SKELET[employer]YNAM[employer]  Right 1 Implanted  SCREW COMP MTHRD 3.5X16 - [id number] Screw SCREW COMP MTHRD 3.5X16  SKELET[employer]YNAM[employer]  Right 1 Implanted  [id number] LOCKING SCREW GOLD MULTI-THREAD      Right 1 Implanted  PROXIMAL ULNA PLATE 151MM RIGHT      Right 1 Implanted  SCREW CORT NLOCK PA 3.5X16 - [id number] Screw SCREW CORT NLOCK PA 3.5X16  [employer]KELET[employer]  Right 1 Implanted  [id number] CORTICAL SCREW NON-LOCKING      Right 1 Implanted  SCREW CORT NLOCK PA 3.5X20 - [id number] Screw SCREW CORT NLOCK PA 3.5X20  SKELETAL [employer]YNAMICS  Right 1 Implanted  ANCHOR SUT FIBERTAK DX DBL - [id number] Anchor ANCHOR SUT FIBERTAK DX DBL  [employer] [id number] Right 1  Implanted  ALIGN RADIAL STEM 9MMX4MM     [id number] Right 1 Implanted  ALIGN RADIAL HEAD & LOCK SCREW     [id number] Right 1 Implanted    Indications for Surgery:   Cassandra Spears is a 73 y.o. female with reported injury 2 to 2-1/2 weeks ago after total hip arthroplasty with injury to the right elbow.  She had delayed presentation.  My partner asked me for assistance in this  complex elbow fracture yesterday and I added on urgently.  Benefits and risks of operative and nonoperative management were discussed prior to surgery with patient/guardian(s) and informed consent form was completed.  Specific risks including infection, need for additional surgery, continued instability, refracture, nonunion and stiffness amongst others.   Procedure:   The patient was identified properly. Informed consent was obtained and the surgical site was marked. The patient was taken up to suite where general anesthesia was induced.  The patient was positioned lateral on a beanbag.  The right elbow was prepped and draped in the usual sterile fashion.  Timeout was performed before the beginning of the case.  Tourniquet was used for the above duration.  We began with a longitudinal posterior approach to the elbow.  With the skin sharply achieving hemostasis.  Grip progressed.  We were able to palpate the fracture and extended our incision about 4 cm distal to the fracture itself.  I initially turned my attention to making full-thickness skin flaps.  We preserved the small skin nerves that we saw.  We then went and identified the ulnar nerve proximally.  We dissected distally performing ulnar nerve decompression noting that was under significant compression at the site of the cubital tunnel.  Once this was released we protected with blunt retractors and avoided at the rest the case.  We did not transpose the nerve.  At that point we turned our attention to the ulnar fracture.  I split the periosteum with the Bovie and elevated subperiosteally at the fracture site.  There was soft callus that was removed.  There is significant fragmentation and comminution at the fracture site itself and there were no cortical keys at this point as the fracture had had significant motion.  I provisionally reduced using clamps and placed a skeletal dynamics olecranon plate and bridge fashion obtaining multiple screws of  locking and nonlocking screws on each side of the fracture.  I had more than 6-8 cortices on each side of the fracture.  I had near-anatomic reduction with excepted combination at the site of the fracture.  We ensured that the fracture was well reduced particularly avoiding varus as that may cause posterior lateral rotatory instability.  We extended our flap laterally and were able to identify Kocher's interval.  With the tissue at Kocher's interval and identified that there was a injury to the lateral collateral ligaments however it appeared to be more chronic in nature than I was expecting.  Upon examination of the radial head the radial head fragment was removed and had completely eroded on the undersurface.  There was complete rounding and cortication of the proximal radial shaft consistent with a chronic nonunion rather than a 2-week delayed presentation.  I elevated the annular ligament as a separate layer and preserved it for eventual closure.  At that point I performed an osteotomy and measured our radial head at 20 mm.  We used a 4 mm spacer and identified that this allowed appropriate alignment on fluoroscopic imaging.  At this point we began  to broach and noted that there is fragmentation of the the proximalmost aspect of the radial shaft.  I passed a suture tape as a cerclage and tied a Nice loop style suture to prevent propagation of the fracture.  I had great fixation with this.  We then irrigated copiously and placed our final implant setting our angle of our skeletal dynamics radial head using the ulnar fovea.  20 mm with a 4 mm spacer and a 9 mm stem was selected.  At this point I felt that repairing the annular ligament with 0 Vicryl was appropriate and we needed to repair the lateral ulnar collateral ligament.  I placed 2 fiber tack anchors from [employer] and used the sutures to whipstitch the lateral ulnar collateral ligament in a running locking Krakw fashion and repair and reinforce  this area.  Final construct had no posterior lateral rotary instability.  Final x-rays were anatomically reduced.  We irrigated copiously placed local vancomycin powder and closed incision in a multilayer fashion finishing with nylon suture.  Sterile dressing and splint replaced.  Patient awoken taken back in stable condition.  Aleck Stalling, PA-C, present and scrubbed throughout the case, critical for completion in a timely fashion, and for retraction, instrumentation, closure.

## 2023-12-25 NOTE — Interval H&P Note (Signed)
 All questions answered

## 2023-12-25 NOTE — Transfer of Care (Signed)
 Immediate Anesthesia Transfer of Care Note  Patient: Cassandra Spears  Procedure(s) Performed: OPEN REDUCTION INTERNAL FIXATION, FRACTURE, OLECRANON (Right: Elbow) ARTHROPLASTY, RADIUS, HEAD (Right: Elbow) REPAIR, LIGAMENT (Right: Elbow)  Patient Location: PACU  Anesthesia Type:General and Regional  Level of Consciousness: awake  Airway & Oxygen Therapy: Patient Spontanous Breathing and Patient connected to face mask oxygen  Post-op Assessment: Report given to RN and Post -op Vital signs reviewed and stable  Post vital signs: Reviewed and stable  Last Vitals:  Vitals Value Taken Time  BP    Temp    Pulse    Resp    SpO2      Last Pain:  Vitals:   12/25/23 1027  TempSrc:   PainSc: 7          Complications: No notable events documented.

## 2023-12-25 NOTE — Discharge Instructions (Signed)
 Bonner Hair MD, MPH Aleck Stalling, PA-C [facility] [address] [phone] (tel)   [phone] (fax)   POST-OPERATIVE INSTRUCTIONS   WOUND CARE Please keep splint clean dry and intact until followup.  You may shower on Post-Op Day #3.  You must keep splint dry during this process and may find that a plastic bag taped around the extremity or alternatively a towel based bath may be a better option.   If you get your splint wet or if it is damaged please contact our clinic.  EXERCISES Due to your splint being in place you will not be able to bear weight through your extremity.    You may use a sling for comfort   It is normal for your fingers/hand to become more swollen after surgery and discolored from bruising.   This will resolve over the first few weeks usually after surgery. Please continue to ambulate and do not stay sitting or lying for too long.  Perform foot and wrist pumps to assist in circulation.   REGIONAL ANESTHESIA (NERVE BLOCKS) The anesthesia team may have performed a nerve block for you this is a great tool used to minimize pain.   The block may start wearing off overnight (between 8-24 hours postop) When the block wears off, your pain may go from nearly zero to the pain you would have had postop without the block. This is an abrupt transition but nothing dangerous is happening.   This can be a challenging period but utilize your as needed pain medications to try and manage this period. We suggest you use the pain medication the first night prior to going to bed, to ease this transition.  You may take an extra dose of narcotic when this happens if needed   POST-OP MEDICATIONS- Multimodal approach to pain control In general your pain will be controlled with a combination of substances.  Prescriptions unless otherwise discussed are electronically sent to your pharmacy.  This is a carefully made plan we use to minimize  narcotic use.     Diclofenac - Anti-inflammatory medication taken on a scheduled basis Acetaminophen  - Non-narcotic pain medicine taken on a scheduled basis  Gabapentin - this is to help with nerve based pain, take on a scheduled basis Oxycodone - This is a strong narcotic, to be used only on an "as needed" basis for SEVERE pain. Zofran - take as needed for nausea   FOLLOW-UP If you develop a Fever (>101.5), Redness or Drainage from the surgical incision site, please call our office to arrange for an evaluation. Please call the office to schedule a follow-up appointment for your incision check if you do not already have one, 7-10 days post-operatively.   HELPFUL INFORMATION    You may be more comfortable sleeping in a semi-seated position the first few nights following surgery.  Keep a pillow propped under the elbow and forearm for comfort.  If you have a recliner type of chair it might be beneficial.  If not that is fine too, but it would be helpful to sleep propped up with pillows behind your operated shoulder as well under your elbow and forearm.  This will reduce pulling on the suture lines.   When dressing, put your operative arm in the sleeve first.  When getting undressed, take your operative arm out last.  Loose fitting, button-down shirts are recommended.  Often in the first days after surgery you may be more comfortable keeping your operative arm under your  shirt and not through the sleeve.   You may return to work/school in the next couple of days when you feel up to it.  Desk work and typing in the sling is fine.   We suggest you use the pain medication the first night prior to going to bed, in order to ease any pain when the anesthesia wears off. You should avoid taking pain medications on an empty stomach as it will make you nauseous.   You should wean off your narcotic medicines as soon as you are able.  Most patients will be off narcotics before their first postop  appointment.    Do not drink alcoholic beverages or take illicit drugs when taking pain medications.   It is against the law to drive while taking narcotics.  In some states it is against the law to drive while your arm is in a sling.    Pain medication may make you constipated.  Below are a few solutions to try in this order:   - Decrease the amount of pain medication if you aren't having pain.   - Drink lots of decaffeinated fluids.   - Drink prune juice and/or each dried prunes   If the first 3 don't work start with additional solutions   - Take Colace - an over-the-counter stool softener   - Take Senokot - an over-the-counter laxative   - Take Miralax - a stronger over-the-counter laxative   For more information including helpful videos and documents visit our website:   [email or web address]

## 2023-12-25 NOTE — H&P (Signed)
 PREOPERATIVE H&P  Chief Complaint: displaced right olecranon fracture  HPI: Cassandra Spears is a 73 y.o. female who is scheduled for, Procedure(s): OPEN REDUCTION INTERNAL FIXATION, FRACTURE, OLECRANON ARTHROPLASTY, RADIUS, HEAD REPAIR, LIGAMENT.   Patient had an injury to the right elbow. She has a displaced fracture.  Symptoms are rated as moderate to severe, and have been worsening.  This is significantly impairing activities of daily living.    Please see clinic note for further details on this patient's care.    She has elected for surgical management.   Past Medical History:  Diagnosis Date   Allergy    Anemia    Arthritis    Asthma    Chronic hyponatremia    GERD (gastroesophageal reflux disease)    Heart murmur    History of vertebral compression fracture    Hypertension    Osteoporosis    Sleep apnea    still being evaluated for CPAP   Past Surgical History:  Procedure Laterality Date   BREAST BIOPSY Right 2002   Broken Ankle Bilateral 2014   ORIF on right ankle, casting on the left ankle   COLONOSCOPY W/ POLYPECTOMY  2018   TOTAL HIP ARTHROPLASTY Left 11/25/2023   Procedure: ARTHROPLASTY, HIP, TOTAL,POSTERIOR APPROACH;  Surgeon: Josefina Chew, MD;  Location: [facility];  Service: Orthopedics;  Laterality: Left;   Uterine Polyp     hysteroscopy   Social History   Socioeconomic History   Marital status: Married    Spouse name: Not on file   Number of children: Not on file   Years of education: Not on file   Highest education level: Not on file  Occupational History   Not on file  Tobacco Use   Smoking status: Never   Smokeless tobacco: Never  Vaping Use   Vaping status: Never Used  Substance and Sexual Activity   Alcohol use: Yes    Comment: a few times a week wine and beer.   Drug use: No   Sexual activity: Yes  Other Topics Concern   Not on file  Social History Narrative   Not on file   Social Drivers of Health   Financial Resource  Strain: Not on file  Food Insecurity: No Food Insecurity (11/25/2023)   Hunger Vital Sign    Worried About Running Out of Food in the Last Year: Never true    Ran Out of Food in the Last Year: Never true  Transportation Needs: No Transportation Needs (11/25/2023)   PRAPARE - Administrator, Civil Service (Medical): No    Lack of Transportation (Non-Medical): No  Physical Activity: Not on file  Stress: Not on file  Social Connections: Socially Integrated (11/25/2023)   Social Connection and Isolation Panel    Frequency of Communication with Friends and Family: More than three times a week    Frequency of Social Gatherings with Friends and Family: More than three times a week    Attends Religious Services: 1 to 4 times per year    Active Member of [employer] or Organizations: Yes    Attends Banker Meetings: 1 to 4 times per year    Marital Status: Married   Family History  Problem Relation Age of Onset   Colon cancer Neg Hx    Esophageal cancer Neg Hx    Pancreatic cancer Neg Hx    Rectal cancer Neg Hx    Stomach cancer Neg Hx    Allergies  Allergen Reactions  Cat Dander Other (See Comments)   Dog Epithelium (Canis Lupus Familiaris) Other (See Comments)   Molds & Smuts Other (See Comments)   Other Other (See Comments)   Prior to Admission medications   Medication Sig Start Date End Date Taking? Authorizing Provider  acetaminophen  (TYLENOL ) 500 MG tablet Take 1,000 mg by mouth 4 (four) times daily as needed (pain.).   Yes [provider]  BREO ELLIPTA  100-25 MCG/INH AEPB Inhale 1 puff into the lungs in the morning. 09/06/16  Yes [provider]  levETIRAcetam  (KEPPRA ) 500 MG tablet Take 1 tablet (500 mg total) by mouth 2 (two) times daily. 10/14/23  Yes Onita Duos, MD  oxyCODONE (ROXICODONE) 5 MG immediate release tablet Take 1 tablet (5 mg total) by mouth every 4 (four) hours as needed for severe pain (pain score 7-10). 11/26/23  Yes Brown,  Blaine K, PA-C  albuterol (PROVENTIL) (2.5 MG/3ML) 0.083% nebulizer solution Take 2.5 mg by nebulization every 6 (six) hours as needed for wheezing or shortness of breath.    [provider]  albuterol (VENTOLIN HFA) 108 (90 Base) MCG/ACT inhaler Inhale 1-2 puffs into the lungs every 6 (six) hours as needed for wheezing or shortness of breath.    [provider]  Ascorbic Acid (VITAMIN C) 1000 MG tablet Take 1,000 mg by mouth 2 (two) times daily.    [provider]  aspirin  EC 325 MG tablet Take 1 tablet (325 mg total) by mouth 2 (two) times daily. 11/26/23   Brown, Blaine K, PA-C  Azelaic Acid (FINACEA) 15 % cream Apply 1 Application topically 2 (two) times daily. After skin is thoroughly washed and patted dry, gently but thoroughly massage a thin film of azelaic acid cream into the affected area twice daily, in the morning and evening.    [provider]  azelastine (ASTELIN) 0.1 % nasal spray Place 1 spray into both nostrils in the morning. Use in each nostril as directed    [provider]  betamethasone dipropionate (DIPROLENE) 0.05 % cream Apply 1 Application topically daily.    [provider]  Calcium -Magnesium-Zinc-Vit D3 (FT CALCIUM -MAGNESIUM-ZINC-D3 PO) Take 2 tablets by mouth in the morning and at bedtime.    [provider]  diclofenac (VOLTAREN) 75 MG EC tablet Take 75 mg by mouth 2 (two) times daily. 09/10/23   [provider]  docusate sodium  (COLACE) 100 MG capsule Take 100 mg by mouth every evening.    [provider]  esomeprazole (NEXIUM) 20 MG capsule Take 20 mg by mouth every evening. 09/06/16   [provider]  estradiol (ESTRACE) 0.1 MG/GM vaginal cream Place 1 Applicatorful vaginally 2 (two) times a week.    [provider]  fexofenadine (ALLEGRA) 180 MG tablet Take 180 mg by mouth in the morning.    [provider]  KRILL OIL PO Take 1 capsule by mouth in the morning.     [provider]  lisinopril  (ZESTRIL ) 40 MG tablet Take 40 mg by mouth in the morning.    [provider]  mometasone (NASONEX) 50 MCG/ACT nasal spray Place 1 spray into the nose at bedtime. 09/17/16   [provider]  montelukast  (SINGULAIR ) 10 MG tablet Take 10 mg by mouth at bedtime.    [provider]  Multiple Vitamins-Minerals (MULTIVITAMIN ADULTS 50+ PO) Take 1 tablet by mouth every other day. In the morning.    [provider]  ondansetron (ZOFRAN) 4 MG tablet Take 1 tablet (4 mg  total) by mouth every 8 (eight) hours as needed for nausea or vomiting. 11/26/23   Brown, Blaine K, PA-C  Probiotic Product (CULTURELLE PROBIOTICS PO) Take 1 tablet by mouth in the morning.    [provider]  rosuvastatin  (CRESTOR ) 10 MG tablet Take 10 mg by mouth at bedtime.    [provider]  sennosides-docusate sodium  (SENOKOT-S) 8.6-50 MG tablet Take 2 tablets by mouth daily. 11/26/23   Brown, Blaine K, PA-C  VITAMIN D  PO Take 2,000 Units by mouth daily.    [provider]  Vitamins A & D (VITAMIN A & D) ointment Apply 1 Application topically daily.    [provider]  Ammon Hoof (PREPARATION H TOTABLES WIPES) 50 % PADS Apply 1 Application topically as needed (after bowel movements.).    [provider]  ZADITOR 0.025 % ophthalmic solution Place 1 drop into both eyes 2 (two) times daily as needed (itchy eyes). 06/19/16   [provider]  Zoledronic  Acid (RECLAST  IV) Inject into the vein See admin instructions. Given once a year for Osteoporosis    [provider]    ROS: All other systems have been reviewed and were otherwise negative with the exception of those mentioned in the HPI and as above.  Physical Exam: General: Alert, no acute distress Cardiovascular: No pedal edema Respiratory: No cyanosis, no use of accessory musculature GI: No organomegaly, abdomen is soft and non-tender Skin: No lesions  in the area of chief complaint Neurologic: Sensation intact distally Psychiatric: Patient is competent for consent with normal mood and affect Lymphatic: No axillary or cervical lymphadenopathy  MUSCULOSKELETAL:  Splint in place. Limited exam due to splint.  Imaging: Xrays demonstrate a displaced fracture of the radial head and ulna  Assessment: displaced right olecranon fracture  Plan: Plan for Procedure(s): OPEN REDUCTION INTERNAL FIXATION, FRACTURE, OLECRANON ARTHROPLASTY, RADIUS, HEAD REPAIR, LIGAMENT  The risks benefits and alternatives were discussed with the patient including but not limited to the risks of nonoperative treatment, versus surgical intervention including infection, bleeding, nerve injury,  blood clots, cardiopulmonary complications, morbidity, mortality, among others, and they were willing to proceed.   The patient acknowledged the explanation, agreed to proceed with the plan and consent was signed.   Operative Plan: ORIF right ulna and radial head Discharge Medications: standard DVT Prophylaxis: none Physical Therapy: outpatient Special Discharge needs: splint. sling   Cassandra LOISE Stalling, PA-C  12/25/2023 9:22 AM

## 2023-12-26 DIAGNOSIS — D509 Iron deficiency anemia, unspecified: Secondary | ICD-10-CM | POA: Diagnosis not present

## 2023-12-26 DIAGNOSIS — Z139 Encounter for screening, unspecified: Secondary | ICD-10-CM | POA: Diagnosis not present

## 2023-12-26 DIAGNOSIS — K5900 Constipation, unspecified: Secondary | ICD-10-CM | POA: Diagnosis not present

## 2023-12-26 DIAGNOSIS — G4733 Obstructive sleep apnea (adult) (pediatric): Secondary | ICD-10-CM | POA: Diagnosis not present

## 2023-12-26 DIAGNOSIS — E871 Hypo-osmolality and hyponatremia: Secondary | ICD-10-CM | POA: Diagnosis not present

## 2023-12-26 DIAGNOSIS — Z1331 Encounter for screening for depression: Secondary | ICD-10-CM | POA: Diagnosis not present

## 2023-12-26 DIAGNOSIS — R351 Nocturia: Secondary | ICD-10-CM | POA: Diagnosis not present

## 2023-12-29 ENCOUNTER — Encounter (HOSPITAL_COMMUNITY): Payer: Self-pay | Admitting: Orthopaedic Surgery

## 2023-12-29 DIAGNOSIS — R262 Difficulty in walking, not elsewhere classified: Secondary | ICD-10-CM | POA: Diagnosis not present

## 2023-12-29 DIAGNOSIS — M25552 Pain in left hip: Secondary | ICD-10-CM | POA: Diagnosis not present

## 2023-12-29 DIAGNOSIS — M6281 Muscle weakness (generalized): Secondary | ICD-10-CM | POA: Diagnosis not present

## 2023-12-30 NOTE — Telephone Encounter (Signed)
 I called the patient to r/s her SS. She is r/s for 02/16/2024 at 9 pm she pushed it out that far because she just had emergancy elbow surgery. She broke 3 bones around her elbow. She is doing PT to learn how to walk with a cain. She was wanted to know how long she needs to be off the pain medicine to have this sleep study?   She also stated she was able to get her new CPAP machine on Friday 12/26/23 and she stated she is using it every night and doing really well with it.   She also wanted Dr. Chalice to be aware that when she had her hip surgery her hip was complete gone.

## 2024-01-01 DIAGNOSIS — M6281 Muscle weakness (generalized): Secondary | ICD-10-CM | POA: Diagnosis not present

## 2024-01-01 DIAGNOSIS — R262 Difficulty in walking, not elsewhere classified: Secondary | ICD-10-CM | POA: Diagnosis not present

## 2024-01-01 DIAGNOSIS — M25552 Pain in left hip: Secondary | ICD-10-CM | POA: Diagnosis not present

## 2024-01-02 DIAGNOSIS — S52121A Displaced fracture of head of right radius, initial encounter for closed fracture: Secondary | ICD-10-CM | POA: Diagnosis not present

## 2024-01-02 DIAGNOSIS — S52021A Displaced fracture of olecranon process without intraarticular extension of right ulna, initial encounter for closed fracture: Secondary | ICD-10-CM | POA: Diagnosis not present

## 2024-01-04 NOTE — Telephone Encounter (Signed)
 CC : Dr Onita,     April Jones, RN     Mrs. Busey is undergoing a PSG ( in lab ) CPAP / biPAP or Oxygen titration study?   She can be on prn pain medication , going meanwhile as low in dose as she can tolerate -   ( this reduced dose  may make the apnea worse, but if that's her current baseline, I still think we need to put her on apnea treatment) .  A sleep study appointment in December or January should be fine if she is able to rest without narcotics ( doesn't have to be a certain time off meds) on more days than not .   In case she still cannot go to sleep without pain meds, she needs just to bring those to the sleep lab.   Dedra Gores, MD

## 2024-01-05 DIAGNOSIS — M6281 Muscle weakness (generalized): Secondary | ICD-10-CM | POA: Diagnosis not present

## 2024-01-05 DIAGNOSIS — M25552 Pain in left hip: Secondary | ICD-10-CM | POA: Diagnosis not present

## 2024-01-05 DIAGNOSIS — R262 Difficulty in walking, not elsewhere classified: Secondary | ICD-10-CM | POA: Diagnosis not present

## 2024-01-07 DIAGNOSIS — M25552 Pain in left hip: Secondary | ICD-10-CM | POA: Diagnosis not present

## 2024-01-07 DIAGNOSIS — Z96621 Presence of right artificial elbow joint: Secondary | ICD-10-CM | POA: Diagnosis not present

## 2024-01-07 DIAGNOSIS — M25521 Pain in right elbow: Secondary | ICD-10-CM | POA: Diagnosis not present

## 2024-01-07 DIAGNOSIS — M6281 Muscle weakness (generalized): Secondary | ICD-10-CM | POA: Diagnosis not present

## 2024-01-07 DIAGNOSIS — R262 Difficulty in walking, not elsewhere classified: Secondary | ICD-10-CM | POA: Diagnosis not present

## 2024-01-09 DIAGNOSIS — E871 Hypo-osmolality and hyponatremia: Secondary | ICD-10-CM | POA: Diagnosis not present

## 2024-01-09 DIAGNOSIS — D509 Iron deficiency anemia, unspecified: Secondary | ICD-10-CM | POA: Diagnosis not present

## 2024-01-09 DIAGNOSIS — Z23 Encounter for immunization: Secondary | ICD-10-CM | POA: Diagnosis not present

## 2024-01-12 DIAGNOSIS — M6281 Muscle weakness (generalized): Secondary | ICD-10-CM | POA: Diagnosis not present

## 2024-01-12 DIAGNOSIS — R262 Difficulty in walking, not elsewhere classified: Secondary | ICD-10-CM | POA: Diagnosis not present

## 2024-01-12 DIAGNOSIS — M25552 Pain in left hip: Secondary | ICD-10-CM | POA: Diagnosis not present

## 2024-01-13 DIAGNOSIS — S52121A Displaced fracture of head of right radius, initial encounter for closed fracture: Secondary | ICD-10-CM | POA: Diagnosis not present

## 2024-01-13 DIAGNOSIS — S52021A Displaced fracture of olecranon process without intraarticular extension of right ulna, initial encounter for closed fracture: Secondary | ICD-10-CM | POA: Diagnosis not present

## 2024-01-14 DIAGNOSIS — R262 Difficulty in walking, not elsewhere classified: Secondary | ICD-10-CM | POA: Diagnosis not present

## 2024-01-14 DIAGNOSIS — M6281 Muscle weakness (generalized): Secondary | ICD-10-CM | POA: Diagnosis not present

## 2024-01-14 DIAGNOSIS — M25552 Pain in left hip: Secondary | ICD-10-CM | POA: Diagnosis not present

## 2024-01-15 DIAGNOSIS — H353132 Nonexudative age-related macular degeneration, bilateral, intermediate dry stage: Secondary | ICD-10-CM | POA: Diagnosis not present

## 2024-01-15 DIAGNOSIS — H52222 Regular astigmatism, left eye: Secondary | ICD-10-CM | POA: Diagnosis not present

## 2024-01-15 DIAGNOSIS — H35372 Puckering of macula, left eye: Secondary | ICD-10-CM | POA: Diagnosis not present

## 2024-01-15 DIAGNOSIS — H524 Presbyopia: Secondary | ICD-10-CM | POA: Diagnosis not present

## 2024-01-15 DIAGNOSIS — H25813 Combined forms of age-related cataract, bilateral: Secondary | ICD-10-CM | POA: Diagnosis not present

## 2024-01-15 DIAGNOSIS — H5213 Myopia, bilateral: Secondary | ICD-10-CM | POA: Diagnosis not present

## 2024-01-19 DIAGNOSIS — M6281 Muscle weakness (generalized): Secondary | ICD-10-CM | POA: Diagnosis not present

## 2024-01-19 DIAGNOSIS — R262 Difficulty in walking, not elsewhere classified: Secondary | ICD-10-CM | POA: Diagnosis not present

## 2024-01-19 DIAGNOSIS — M25552 Pain in left hip: Secondary | ICD-10-CM | POA: Diagnosis not present

## 2024-01-20 ENCOUNTER — Encounter

## 2024-01-23 DIAGNOSIS — E871 Hypo-osmolality and hyponatremia: Secondary | ICD-10-CM | POA: Diagnosis not present

## 2024-01-26 DIAGNOSIS — M6281 Muscle weakness (generalized): Secondary | ICD-10-CM | POA: Diagnosis not present

## 2024-01-26 DIAGNOSIS — R262 Difficulty in walking, not elsewhere classified: Secondary | ICD-10-CM | POA: Diagnosis not present

## 2024-01-26 DIAGNOSIS — M25552 Pain in left hip: Secondary | ICD-10-CM | POA: Diagnosis not present

## 2024-01-28 DIAGNOSIS — M25552 Pain in left hip: Secondary | ICD-10-CM | POA: Diagnosis not present

## 2024-01-28 DIAGNOSIS — M6281 Muscle weakness (generalized): Secondary | ICD-10-CM | POA: Diagnosis not present

## 2024-01-28 DIAGNOSIS — R262 Difficulty in walking, not elsewhere classified: Secondary | ICD-10-CM | POA: Diagnosis not present

## 2024-02-02 DIAGNOSIS — M25552 Pain in left hip: Secondary | ICD-10-CM | POA: Diagnosis not present

## 2024-02-02 DIAGNOSIS — R262 Difficulty in walking, not elsewhere classified: Secondary | ICD-10-CM | POA: Diagnosis not present

## 2024-02-02 DIAGNOSIS — M6281 Muscle weakness (generalized): Secondary | ICD-10-CM | POA: Diagnosis not present

## 2024-02-03 ENCOUNTER — Encounter

## 2024-02-16 ENCOUNTER — Encounter

## 2024-02-22 ENCOUNTER — Ambulatory Visit: Payer: Self-pay | Admitting: Neurology

## 2024-03-02 NOTE — Telephone Encounter (Signed)
 CPAP [employer] pending new auth

## 2024-03-03 NOTE — Telephone Encounter (Signed)
 Updated new auth:  CPAP Mylene barrows: [id number] (exp. 03/02/24 to 05/25/24)   Patient is scheduled at [facility] for 03/19/24 at 9 pm

## 2024-03-19 ENCOUNTER — Ambulatory Visit (INDEPENDENT_AMBULATORY_CARE_PROVIDER_SITE_OTHER): Admitting: Neurology

## 2024-03-19 DIAGNOSIS — G454 Transient global amnesia: Secondary | ICD-10-CM

## 2024-03-19 DIAGNOSIS — R0683 Snoring: Secondary | ICD-10-CM

## 2024-03-19 DIAGNOSIS — G4733 Obstructive sleep apnea (adult) (pediatric): Secondary | ICD-10-CM

## 2024-03-19 DIAGNOSIS — G4736 Sleep related hypoventilation in conditions classified elsewhere: Secondary | ICD-10-CM

## 2024-03-19 DIAGNOSIS — R9401 Abnormal electroencephalogram [EEG]: Secondary | ICD-10-CM

## 2024-03-19 DIAGNOSIS — G4701 Insomnia due to medical condition: Secondary | ICD-10-CM

## 2024-03-22 ENCOUNTER — Ambulatory Visit: Admitting: Gastroenterology

## 2024-03-30 ENCOUNTER — Ambulatory Visit: Payer: Self-pay | Admitting: Neurology

## 2024-03-30 DIAGNOSIS — G4733 Obstructive sleep apnea (adult) (pediatric): Secondary | ICD-10-CM

## 2024-03-30 NOTE — Procedures (Signed)
 "  [facility] PAP TITRATION INTERPRETATION REPORT   STUDY DATE: 03/19/2024      PATIENT NAME:  Cassandra Spears         DATE OF BIRTH:  [birth year]  PATIENT ID:  [id number]    TYPE OF STUDY:  CPAP   PHYSICIAN: DEDRA GORES, MD SCORING TECHNICIAN: Delon Sprung, RPSGT   HISTORY: This 74 year-old Female patient of Dr Onita was seen on 10-16-2023 and a HST was ordered. The HSTest's  results from 11-11-2023 revealed severe sleep apnea ( OSA) with an AHI of 46.6/h , and a FSS at 42/63. The Epworth Sleepiness Scale was endorsed at  6 out of 24 (scores above or equal to 10 are suggestive of hypersomnolence).  DESCRIPTION: A sleep technologist was in attendance for the duration of the recording.  Data collection, scoring, video monitoring, and reporting were performed in compliance with the AASM Manual for the Scoring of Sleep and Associated Events; (Hypopnea is scored based on the criteria listed in Section VIII D. 1b in the AASM Manual V2.6 using a 4% oxygen desaturation rule or Hypopnea is scored based on the criteria listed in Section VIII D. 1a in the AASM Manual V2.6 using 3% oxygen desaturation and /or arousal rule).  A physician certified by the American Board of Sleep Medicine reviewed each epoch of the study.  ADDITIONAL INFORMATION:  Height: 61.0 in Weight: 198 lb (BMI 37) Neck Size: 14.5 in    MEDICATIONS: Acetaminophen  1,000 mg 3 times daily PRN Albuterol  Sulfate 2.5 mg Every 6 hours PRN 108 (90 Base) MCG/ACT Aers, 1-2 puffs Every 6 hours PRN ALPRAZolam  0.5 mg Oral At bedtime PRN Ascorbic Acid 1,000 mg 2 times daily Azelaic Acid 15 % 2 times daily Betamethasone Dipropionate 0.05 % As needed Diclofenac  Sodium 75 mg 2 times daily 2 g 4 times daily Docusate Sodium  1 tablet Daily PRN Esomeprazole Magnesium  20 MG Take 20 mg by mouth every evening. Estradiol 0.1 MG/GM 1 Applicatorful Daily at bedtime Fexofenadine HCl 180 mg Daily Fluticasone  Furoate-Vilanterol  100-25 MCG/INH Inhale 1 puff into the lungs in the morning. Ketotifen  Fumarate 0.025 % 1 drop 2 times daily PRN levETIRAcetam  500 mg Oral 2 times daily Lisinopril  40 mg Daily Mometasone Furoate 50 MCG/ACT 1 spray Daily Montelukast  Sodium 10 mg Daily at bedtime Multiple Vitamins-Minerals 1 tablet Every other day OVER THE COUNTER MEDICATION 1 tablet Daily Calcium  Citrate/ vitamin D3/ and Magnesium  4000 mg. Taken 2 times daily. Probiotic Product 1 tablet Daily Rosuvastatin  Calcium  10 mg Daily at bedtime traMADol  HCl 50 mg 3 times daily PRN Vitamin D ,Cholecalciferol  2,000 Units Daily Zoledronic  Acid See admin instructions XANAX  sleep aid used the night of this test.     SLEEP CONTINUITY AND SLEEP ARCHITECTURE:   Lights off was at 22:44: and lights on 05:06: (6.4 hours in bed). Total sleep time was 72.5 minutes with a decreased sleep efficiency at 72.1%. Sleep latency was decreased at 4.0 minutes.  CPAP was initiated at 4 cm water , and increased to 6 cm water , using an AirFit N10 in medium size. There was oral airflow, the patient will need a chin strap to start using at home.   Of the total sleep time, the percentage of stage N1 sleep was 7.6%, stage N2 sleep was 82.6%, stage N3 sleep was 0.0%, and REM sleep was 9.8%. There were 3 Stage R periods observed on this study night, 13 awakenings (i.e. transitions to Stage W from any sleep stage), and 50.0  total stage transitions.  Wake after sleep onset (WASO) time accounted for 102 minutes.  AROUSAL: There were 33 arousals in total.  Of these, 7 were identified as respiratory-related arousals (1.5 /h), 0 were PLM-related arousals (0.0 /h), and 26 were non-specific arousals (5.7 /h)  RESPIRATORY MONITORING:  Based on CMS criteria (using a 4% oxygen desaturation rule for scoring hypopneas), there were 0 apneas (0 obstructive; 0 central; 0 mixed), and 27 hypopneas present.   The Apnea index was 0.0. The Hypopnea index was 5.9. The total AHI (  apnea-hypopnea index)  was 5.9/h  (AHI was 35.1 in supine, 3.5/h in non-supine; 6.7/h in  REM, 5.8/h in NREM). There were 0 respiratory effort-related arousals (RERAs).   OXIMETRY: Respiratory events were associated with oxyhemoglobin desaturations (nadir during sleep 83% from a mean of 94%).  Total sleep time spent below 89% was 72 minutes, or 2.5% of total sleep time. Snoring was classified as mild . EKG: The electrocardiogram documented regular heart rhythm (NSR) with isolated PVCs . The average heart rate during sleep was 74 bpm.  The maximum heart rate during sleep was 90 bpm.     BODY POSITION: Duration of total sleep and percent of total sleep in their respective position is as follows: supine 20 minutes (7.4%), non-supine 255.0 minutes (92.6%); right 159 minutes (57.7%), left 96 minutes (34.8%), and prone 00 minutes (0.0%). Total supine REM sleep time was 00 minutes (0.0% of total REM sleep). LIMB MOVEMENTS: There were 0 periodic limb movements of sleep .  IMPRESSION:   1.Severe obstructive Sleep Apnea ( OSA) was successfully treated by CPAP pressure of 6 cm water  , but the patient did not sleep in supine under this setting. The apnea count would likely have risen if the patient had been seen in supine sleep. For this reason , a higher auto CPAP maximal pressure will be set.         RECOMMENDATIONS: I like for the patient to avoid supine sleep, wear her preferred AirFit N10 in medium size and use a chin strap in addition. The auto- CPAP device will be set from 5 through 10 cm water , 1 cm EPR.    DEDRA GORES,  MD            General Information  Name: Cassandra Spears BMI: 37 Physician: ,   ID: [id number] Height: 61 in Technician: Jackson Nest  Sex: Female Weight: 198 lb Record: [id number]  Age: 23 [[birth year]] Date: 03/19/2024 Scorer: Nest Jackson   Recommended Settings IPAP: N/A cmH20 EPAP: N/A cmH2O AHI: N/A AHI (4%): N/A   Pressure IPAP/EPAP 00 04 05 06   O2 Vol 0.0 0.0 0.0 0.0   Time TRT 0.39m 140.95m 85.62m 156.50m   TST 0.45m 133.21m 60.54m 83.81m  Sleep Stage % Wake 100.0 5.0 29.4 47.0   % REM 0.0 13.2 0.0 11.4   % N1 0.0 3.4 13.3 10.2   % N2 0.0 83.5 86.7 77.7   % N3 0.0 0.0 0.0 0.0  Respiratory Total Events 0 9 13 5    Obs. Apn. 0 0 0 0   Mixed Apn. 0 0 0 0   Cen. Apn. 0 0 0 0   Hypopneas 0 9 13 5    AHI 0.00 4.06 13.00 3.61   Supine AHI 0.00 33.60 37.50 0.00   Prone AHI 0.00 0.00 0.00 0.00   Side AHI 0.00 1.00 9.23 3.61  Respiratory (4%) Hypopneas (4%) 0.00 9.00 13.00 5.00   AHI (4%) 0.00 4.06 13.00 3.61  Supine AHI (4%) 0.00 33.60 37.50 0.00   Prone AHI (4%) 0.00 0.00 0.00 0.00   Side AHI (4%) 0.00 1.00 9.23 3.61  Desat Profile <= 90% 0.33m 9.90m 12.45m 8.4m   <= 80% 0.28m 0.83m 4.45m 6.81m   <= 70% 0.17m 0.45m 4.27m 6.15m   <= 60% 0.52m 0.64m 4.100m 6.28m  Arousal Index Apnea 0.0 0.0 0.0 0.0   Hypopnea 0.0 0.5 5.0 0.7   LM 0.0 0.0 0.0 0.0   Spontaneous 0.0 4.1 8.0 6.5   "

## 2024-04-01 NOTE — Telephone Encounter (Signed)
 Abbe Bula D, CMA  Joylene, Bradley; Ziegler, Melissa; Tucker, Dolanda; Soldano, Christina; Sheree Glatter New orders have been placed for the above pt, DOB: [birth year] Thanks

## 2024-05-12 ENCOUNTER — Ambulatory Visit: Admitting: Gastroenterology

## 2024-05-19 ENCOUNTER — Ambulatory Visit: Admitting: Adult Health
